# Patient Record
Sex: Male | Born: 1955 | Race: Black or African American | Hispanic: No | Marital: Married | Smoking: Former smoker
Health system: Southern US, Community
[De-identification: ages and names within clinical notes are randomized; demographics above are authoritative.]

## PROBLEM LIST (undated history)

## (undated) DIAGNOSIS — T7840XA Allergy, unspecified, initial encounter: Secondary | ICD-10-CM

## (undated) DIAGNOSIS — I10 Essential (primary) hypertension: Secondary | ICD-10-CM

## (undated) DIAGNOSIS — B1920 Unspecified viral hepatitis C without hepatic coma: Secondary | ICD-10-CM

## (undated) HISTORY — DX: Essential (primary) hypertension: I10

## (undated) HISTORY — DX: Allergy, unspecified, initial encounter: T78.40XA

## (undated) HISTORY — PX: OTHER SURGICAL HISTORY: SHX169

---

## 2002-12-17 ENCOUNTER — Encounter: Admission: RE | Admit: 2002-12-17 | Discharge: 2002-12-17 | Payer: Self-pay | Admitting: Internal Medicine

## 2004-03-17 ENCOUNTER — Emergency Department (HOSPITAL_COMMUNITY): Admission: EM | Admit: 2004-03-17 | Discharge: 2004-03-17 | Payer: Self-pay | Admitting: Emergency Medicine

## 2006-04-01 HISTORY — PX: COLONOSCOPY: SHX174

## 2006-07-11 ENCOUNTER — Ambulatory Visit: Payer: Self-pay | Admitting: Gastroenterology

## 2006-07-25 ENCOUNTER — Ambulatory Visit: Payer: Self-pay | Admitting: Gastroenterology

## 2011-04-16 ENCOUNTER — Ambulatory Visit (INDEPENDENT_AMBULATORY_CARE_PROVIDER_SITE_OTHER): Payer: BC Managed Care – PPO

## 2011-04-16 DIAGNOSIS — Z0000 Encounter for general adult medical examination without abnormal findings: Secondary | ICD-10-CM

## 2011-04-16 DIAGNOSIS — N529 Male erectile dysfunction, unspecified: Secondary | ICD-10-CM

## 2011-04-16 DIAGNOSIS — R079 Chest pain, unspecified: Secondary | ICD-10-CM

## 2011-05-20 ENCOUNTER — Encounter: Payer: Self-pay | Admitting: Gastroenterology

## 2011-05-22 ENCOUNTER — Ambulatory Visit (INDEPENDENT_AMBULATORY_CARE_PROVIDER_SITE_OTHER): Payer: BC Managed Care – PPO | Admitting: Family Medicine

## 2011-05-22 ENCOUNTER — Ambulatory Visit: Payer: BC Managed Care – PPO

## 2011-05-22 VITALS — BP 148/83 | HR 66 | Temp 98.0°F | Resp 18 | Ht 65.0 in | Wt 178.0 lb

## 2011-05-22 DIAGNOSIS — R911 Solitary pulmonary nodule: Secondary | ICD-10-CM

## 2011-05-22 DIAGNOSIS — R7401 Elevation of levels of liver transaminase levels: Secondary | ICD-10-CM

## 2011-05-22 DIAGNOSIS — B182 Chronic viral hepatitis C: Secondary | ICD-10-CM

## 2011-05-22 DIAGNOSIS — R748 Abnormal levels of other serum enzymes: Secondary | ICD-10-CM

## 2011-05-22 NOTE — Progress Notes (Signed)
  Subjective:    Patient ID: Gregory Holloway, male    DOB: Jan 24, 1956, 56 y.o.   MRN: [id number]  HPI    Review of Systems     Objective:   Physical Exam   [facility] reading (PRIMARY) by  Dr. Alwyn Ren Normal xray      Assessment & Plan:
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
## 2011-05-23 LAB — HEPATIC FUNCTION PANEL
Bilirubin, Direct: 0.1 mg/dL (ref 0.0–0.3)
Indirect Bilirubin: 0.6 mg/dL (ref 0.0–0.9)

## 2011-05-24 ENCOUNTER — Encounter: Payer: Self-pay | Admitting: Family Medicine

## 2013-11-27 ENCOUNTER — Ambulatory Visit (INDEPENDENT_AMBULATORY_CARE_PROVIDER_SITE_OTHER): Payer: BC Managed Care – PPO | Admitting: Family Medicine

## 2013-11-27 VITALS — BP 164/88 | HR 63 | Temp 97.8°F | Resp 15 | Ht 66.0 in | Wt 175.2 lb

## 2013-11-27 DIAGNOSIS — Z8719 Personal history of other diseases of the digestive system: Secondary | ICD-10-CM

## 2013-11-27 DIAGNOSIS — Z23 Encounter for immunization: Secondary | ICD-10-CM

## 2013-11-27 DIAGNOSIS — B182 Chronic viral hepatitis C: Secondary | ICD-10-CM

## 2013-11-27 DIAGNOSIS — Z0000 Encounter for general adult medical examination without abnormal findings: Secondary | ICD-10-CM

## 2013-11-27 LAB — POCT URINALYSIS DIPSTICK
Bilirubin, UA: NEGATIVE
Glucose, UA: NEGATIVE
Ketones, UA: NEGATIVE
Leukocytes, UA: NEGATIVE
Nitrite, UA: NEGATIVE
Protein, UA: NEGATIVE
Spec Grav, UA: 1.015
Urobilinogen, UA: 0.2
pH, UA: 5

## 2013-11-27 LAB — POCT CBC
Granulocyte percent: 45.7 %G (ref 37–80)
HCT, POC: 43 % — AB (ref 43.5–53.7)
Hemoglobin: 13.7 g/dL — AB (ref 14.1–18.1)
Lymph, poc: 3 (ref 0.6–3.4)
MCH, POC: 30.2 pg (ref 27–31.2)
MCHC: 31.7 g/dL — AB (ref 31.8–35.4)
MCV: 95.2 fL (ref 80–97)
MID (cbc): 0.4 (ref 0–0.9)
MPV: 8.6 fL (ref 0–99.8)
POC Granulocyte: 2.9 (ref 2–6.9)
POC LYMPH PERCENT: 47.5 %L (ref 10–50)
POC MID %: 6.8 %M (ref 0–12)
Platelet Count, POC: 192 10*3/uL (ref 142–424)
RBC: 4.52 M/uL — AB (ref 4.69–6.13)
RDW, POC: 12.1 %
WBC: 6.3 10*3/uL (ref 4.6–10.2)

## 2013-11-27 LAB — COMPREHENSIVE METABOLIC PANEL
ALT: 53 U/L (ref 0–53)
AST: 35 U/L (ref 0–37)
Albumin: 4.2 g/dL (ref 3.5–5.2)
Alkaline Phosphatase: 57 U/L (ref 39–117)
BUN: 12 mg/dL (ref 6–23)
CO2: 28 mEq/L (ref 19–32)
Calcium: 9.6 mg/dL (ref 8.4–10.5)
Chloride: 105 mEq/L (ref 96–112)
Creat: 1.27 mg/dL (ref 0.50–1.35)
Glucose, Bld: 93 mg/dL (ref 70–99)
Potassium: 4.3 mEq/L (ref 3.5–5.3)
Sodium: 140 mEq/L (ref 135–145)
Total Bilirubin: 0.7 mg/dL (ref 0.2–1.2)
Total Protein: 7.9 g/dL (ref 6.0–8.3)

## 2013-11-27 LAB — LIPID PANEL
Cholesterol: 161 mg/dL (ref 0–200)
HDL: 38 mg/dL — ABNORMAL LOW (ref >39)
LDL Cholesterol: 109 mg/dL — ABNORMAL HIGH (ref 0–99)
Total CHOL/HDL Ratio: 4.2 Ratio
Triglycerides: 68 mg/dL (ref <150)
VLDL: 14 mg/dL (ref 0–40)

## 2013-11-27 LAB — HEPATITIS C ANTIBODY: HCV Ab: REACTIVE — AB

## 2013-11-27 LAB — TSH: TSH: 1.073 u[IU]/mL (ref 0.350–4.500)

## 2013-11-27 MED ORDER — TETANUS-DIPHTH-ACELL PERTUSSIS 5-2.5-18.5 LF-MCG/0.5 IM SUSP: 0.5000 mL | Freq: Once | INTRAMUSCULAR | Status: DC

## 2013-11-27 NOTE — Progress Notes (Addendum)
° °  Subjective:    Patient ID: Gregory Holloway, male    DOB: Aug 14, 1955, 58 y.o.   MRN: [id number]  HPI Chief Complaint  Patient presents with   Annual Exam   This chart was scribed for Elvina Sidle , MD by Andrew Au, ED Scribe. This patient was seen in room 8 and the patient's care was started at 10:41 AM.  HPI Comments: Gregory Holloway is a 58 y.o. male who presents to the [facility] for a physical. Pt is married with 6 children.   Colonoscopy 2009. Pt was diagnosed with Hep C 15 years ago. Pt was a former drug abuser. Pt has not smoke or drank alcohol in 23 years.  Pt has multiple complaint.  Pt c/o muscle ache and fatigue.  He reports dizziness and light headedness after working in yard. Pt reports SOB with movement. He denies SOB waking him up at night.  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   History reviewed. No pertinent past medical history. History reviewed. No pertinent past surgical history. Prior to Admission medications   Not on File   Review of Systems  Constitutional: Positive for fatigue.  Respiratory: Positive for shortness of breath.   Musculoskeletal: Positive for arthralgias and myalgias.  Skin: Positive for rash.  Neurological: Positive for dizziness and light-headedness.   Objective:   Physical Exam  Nursing note and vitals reviewed. Constitutional: He is oriented to person, place, and time. He appears well-developed and well-nourished. No distress.  HENT:  Head: Normocephalic and atraumatic.  Eyes: Conjunctivae and EOM are normal.  Neck: Neck supple.  Cardiovascular: Normal rate, regular rhythm and normal heart sounds.  Exam reveals no gallop and no friction rub.   No murmur heard. Pulmonary/Chest: Effort normal and breath sounds normal. No respiratory distress. He has no wheezes. He has no rales. He exhibits no tenderness.  Abdominal: Soft. Bowel sounds are normal. He  exhibits no distension and no mass. There is no tenderness. There is no rebound and no guarding.  Genitourinary: Rectum normal.  Normal rectal exam  Musculoskeletal: Normal range of motion. He exhibits edema ( trace).  Neurological: He is alert and oriented to person, place, and time.  Skin: Skin is warm and dry.  Psychiatric: He has a normal mood and affect. His behavior is normal.   Assessment & Plan:    Routine general medical examination at a health care facility - Plan: EKG 12-Lead, POCT CBC, POCT urinalysis dipstick, TSH, PSA, Lipid panel, Hepatitis C antibody, Comprehensive metabolic panel  Hep C w/o coma, chronic - Plan: EKG 12-Lead, POCT CBC, POCT urinalysis dipstick, TSH, PSA, Lipid panel, Hepatitis C antibody, Comprehensive metabolic panel  H/O chronic hepatitis  Signed, Elvina Sidle, MD

## 2013-11-27 NOTE — Patient Instructions (Signed)
Physical exam is essentially normal. I don't detect any problems with the prostate, abdomen, chest or heart. EKG is normal as well. We're going to see if there is a thyroid problem, probably cholesterol, or anemia. These tests will take 3-4 days to run and he should get a call back Tuesday Wednesday next week.    Health Maintenance A healthy lifestyle and preventative care can promote health and wellness.  Maintain regular health, dental, and eye exams.  Eat a healthy diet. Foods like vegetables, fruits, whole grains, low-fat dairy products, and lean protein foods contain the nutrients you need and are low in calories. Decrease your intake of foods high in solid fats, added sugars, and salt. Get information about a proper diet from your health care provider, if necessary.  Regular physical exercise is one of the most important things you can do for your health. Most adults should get at least 150 minutes of moderate-intensity exercise (any activity that increases your heart rate and causes you to sweat) each week. In addition, most adults need muscle-strengthening exercises on 2 or more days a week.   Maintain a healthy weight. The body mass index (BMI) is a screening tool to identify possible weight problems. It provides an estimate of body fat based on height and weight. Your health care provider can find your BMI and can help you achieve or maintain a healthy weight. For males 20 years and older:  A BMI below 18.5 is considered underweight.  A BMI of 18.5 to 24.9 is normal.  A BMI of 25 to 29.9 is considered overweight.  A BMI of 30 and above is considered obese.  Maintain normal blood lipids and cholesterol by exercising and minimizing your intake of saturated fat. Eat a balanced diet with plenty of fruits and vegetables. Blood tests for lipids and cholesterol should begin at age 10 and be repeated every 5 years. If your lipid or cholesterol levels are high, you are over age 33, or you  are at high risk for heart disease, you may need your cholesterol levels checked more frequently.Ongoing high lipid and cholesterol levels should be treated with medicines if diet and exercise are not working.  If you smoke, find out from your health care provider how to quit. If you do not use tobacco, do not start.  Lung cancer screening is recommended for adults aged 55-80 years who are at high risk for developing lung cancer because of a history of smoking. A yearly low-dose CT scan of the lungs is recommended for people who have at least a 30-pack-year history of smoking and are current smokers or have quit within the past 15 years. A pack year of smoking is smoking an average of 1 pack of cigarettes a day for 1 year (for example, a 30-pack-year history of smoking could mean smoking 1 pack a day for 30 years or 2 packs a day for 15 years). Yearly screening should continue until the smoker has stopped smoking for at least 15 years. Yearly screening should be stopped for people who develop a health problem that would prevent them from having lung cancer treatment.  If you choose to drink alcohol, do not have more than 2 drinks per day. One drink is considered to be 12 oz (360 mL) of beer, 5 oz (150 mL) of wine, or 1.5 oz (45 mL) of liquor.  Avoid the use of street drugs. Do not share needles with anyone. Ask for help if you need support or instructions about  stopping the use of drugs.  High blood pressure causes heart disease and increases the risk of stroke. Blood pressure should be checked at least every 1-2 years. Ongoing high blood pressure should be treated with medicines if weight loss and exercise are not effective.  If you are 64-64 years old, ask your health care provider if you should take aspirin to prevent heart disease.  Diabetes screening involves taking a blood sample to check your fasting blood sugar level. This should be done once every 3 years after age 72 if you are at a normal  weight and without risk factors for diabetes. Testing should be considered at a younger age or be carried out more frequently if you are overweight and have at least 1 risk factor for diabetes.  Colorectal cancer can be detected and often prevented. Most routine colorectal cancer screening begins at the age of 3 and continues through age 61. However, your health care provider may recommend screening at an earlier age if you have risk factors for colon cancer. On a yearly basis, your health care provider may provide home test kits to check for hidden blood in the stool. A small camera at the end of a tube may be used to directly examine the colon (sigmoidoscopy or colonoscopy) to detect the earliest forms of colorectal cancer. Talk to your health care provider about this at age 24 when routine screening begins. A direct exam of the colon should be repeated every 5-10 years through age 74, unless early forms of precancerous polyps or small growths are found.  People who are at an increased risk for hepatitis B should be screened for this virus. You are considered at high risk for hepatitis B if:  You were born in a country where hepatitis B occurs often. Talk with your health care provider about which countries are considered high risk.  Your parents were born in a high-risk country and you have not received a shot to protect against hepatitis B (hepatitis B vaccine).  You have HIV or AIDS.  You use needles to inject street drugs.  You live with, or have sex with, someone who has hepatitis B.  You are a man who has sex with other men (MSM).  You get hemodialysis treatment.  You take certain medicines for conditions like cancer, organ transplantation, and autoimmune conditions.  Hepatitis C blood testing is recommended for all people born from [birth year] through 1966 and any individual with known risk factors for hepatitis C.  Healthy men should no longer receive prostate-specific antigen (PSA) blood  tests as part of routine cancer screening. Talk to your health care provider about prostate cancer screening.  Testicular cancer screening is not recommended for adolescents or adult males who have no symptoms. Screening includes self-exam, a health care provider exam, and other screening tests. Consult with your health care provider about any symptoms you have or any concerns you have about testicular cancer.  Practice safe sex. Use condoms and avoid high-risk sexual practices to reduce the spread of sexually transmitted infections (STIs).  You should be screened for STIs, including gonorrhea and chlamydia if:  You are sexually active and are younger than 24 years.  You are older than 24 years, and your health care provider tells you that you are at risk for this type of infection.  Your sexual activity has changed since you were last screened, and you are at an increased risk for chlamydia or gonorrhea. Ask your health care provider if you are  at risk.  If you are at risk of being infected with HIV, it is recommended that you take a prescription medicine daily to prevent HIV infection. This is called pre-exposure prophylaxis (PrEP). You are considered at risk if:  You are a man who has sex with other men (MSM).  You are a heterosexual man who is sexually active with multiple partners.  You take drugs by injection.  You are sexually active with a partner who has HIV.  Talk with your health care provider about whether you are at high risk of being infected with HIV. If you choose to begin PrEP, you should first be tested for HIV. You should then be tested every 3 months for as long as you are taking PrEP.  Use sunscreen. Apply sunscreen liberally and repeatedly throughout the day. You should seek shade when your shadow is shorter than you. Protect yourself by wearing long sleeves, pants, a wide-brimmed hat, and sunglasses year round whenever you are outdoors.  Tell your health care  provider of new moles or changes in moles, especially if there is a change in shape or color. Also, tell your health care provider if a mole is larger than the size of a pencil eraser.  A one-time screening for abdominal aortic aneurysm (AAA) and surgical repair of large AAAs by ultrasound is recommended for men aged 65-75 years who are current or former smokers.  Stay current with your vaccines (immunizations). Document Released: 09/14/2007 Document Revised: 03/23/2013 Document Reviewed: 08/13/2010 [facility] Patient Information 2015 [place], [place]. This information is not intended to replace advice given to you by your health care provider. Make sure you discuss any questions you have with your health care provider.

## 2013-11-29 LAB — PSA: PSA: 1.25 ng/mL (ref <=4.00)

## 2013-12-11 ENCOUNTER — Telehealth: Payer: Self-pay

## 2013-12-11 NOTE — Telephone Encounter (Signed)
Patient dropped off form to be completed by Dr. Elbert Ewings. He recently had a physical with Dr. Elbert Ewings. Please call when ready.   Best: [phone]

## 2015-06-15 ENCOUNTER — Emergency Department (HOSPITAL_COMMUNITY)
Admission: EM | Admit: 2015-06-15 | Discharge: 2015-06-15 | Disposition: A | Discharge status: Home / Self Care | Payer: BLUE CROSS/BLUE SHIELD | Source: Home / Self Care | Attending: Family Medicine | Admitting: Family Medicine

## 2015-06-15 ENCOUNTER — Encounter (HOSPITAL_COMMUNITY): Payer: Self-pay | Admitting: Emergency Medicine

## 2015-06-15 DIAGNOSIS — R6889 Other general symptoms and signs: Secondary | ICD-10-CM | POA: Diagnosis not present

## 2015-06-15 NOTE — Discharge Instructions (Signed)
Influenza, Adult Influenza (flu) is an infection in the mouth, nose, and throat (respiratory tract) caused by a virus. The flu can make you feel very ill. Influenza spreads easily from person to person (contagious).  HOME CARE   Only take medicines as told by your doctor.  Use a cool mist humidifier to make breathing easier.  Get plenty of rest until your fever goes away. This usually takes 3 to 4 days.  Drink enough fluids to keep your pee (urine) clear or pale yellow.  Cover your mouth and nose when you cough or sneeze.  Wash your hands well to avoid spreading the flu.  Stay home from work or school until your fever has been gone for at least 1 full day.  Get a flu shot every year. GET HELP RIGHT AWAY IF:   You have trouble breathing or feel short of breath.  Your skin or nails turn blue.  You have severe neck pain or stiffness.  You have a severe headache, facial pain, or earache.  Your fever gets worse or keeps coming back.  You feel sick to your stomach (nauseous), throw up (vomit), or have watery poop (diarrhea).  You have chest pain.  You have a deep cough that gets worse, or you cough up more thick spit (mucus). MAKE SURE YOU:   Understand these instructions.  Will watch your condition.  Will get help right away if you are not doing well or get worse.   This information is not intended to replace advice given to you by your health care provider. Make sure you discuss any questions you have with your health care provider.   Document Released: 12/26/2007 Document Revised: 04/08/2014 Document Reviewed: 06/17/2011 Elsevier Interactive Patient Education 2016 [employer]  

## 2015-06-15 NOTE — ED Provider Notes (Signed)
CSN: [id number]     Arrival date & time 06/15/15  1743 History   First MD Initiated Contact with Patient 06/15/15 1857     Chief Complaint  Patient presents with  . URI   (Consider location/radiation/quality/duration/timing/severity/associated sxs/prior Treatment) HPI Pt presents with sore throat, body aches, fever, chills for 1 days Home treatment has been OTC meds without much relief of symptoms Fever is improved for short periods of time with OTC antipyretics. Pain score is 4 mostly from coughing and body aches Taking fluids, no appetite No flu shot Has been exposed to others with similar symptoms.  Denies: CP, SOB, vomiting or diarrhea.  History reviewed. No pertinent past medical history. History reviewed. No pertinent past surgical history. Family History  Problem Relation Age of Onset  . Hypertension Sister    Social History  Substance Use Topics  . Smoking status: Former Games developermoker  . Smokeless tobacco: Never Used  . Alcohol Use: No    Review of Systems See HPI Allergies  Review of patient's allergies indicates no known allergies.  Home Medications   Prior to Admission medications   Medication Sig Start Date End Date Taking? Authorizing Provider  fexofenadine-pseudoephedrine (ALLEGRA-D 24) 180-240 MG 24 hr tablet Take 1 tablet by mouth daily.   Yes Historical Provider, MD  OVER THE COUNTER MEDICATION otc cold medicine   Yes Historical Provider, MD   Meds Ordered and Administered this Visit  Medications - No data to display  BP 155/84 mmHg  Pulse 96  Temp(Src) 100.9 F (38.3 C) (Oral)  Resp 16  SpO2 98% No data found.   Physical Exam NURSES NOTES AND VITAL SIGNS REVIEWED. CONSTITUTIONAL: Well developed, well nourished, no acute distress HEENT: normocephalic, atraumatic, right and left TM's are normal EYES: Conjunctiva normal NECK:normal ROM, supple, no adenopathy PULMONARY:No respiratory distress, normal effort, Lungs: CTAb/l, no wheezes, or increased  work of breathing CARDIOVASCULAR: RRR, no murmur ABDOMEN: soft, ND, NT, +'ve BS MUSCULOSKELETAL: Normal ROM of all extremities,  SKIN: warm and dry without rash PSYCHIATRIC: Mood and affect, behavior are normal  ED Course  Procedures (including critical care time)  Labs Review Labs Reviewed - No data to display  Imaging Review No results found.   Visual Acuity Review  Right Eye Distance:   Left Eye Distance:   Bilateral Distance:    Right Eye Near:   Left Eye Near:    Bilateral Near:         MDM   1. Flu-like symptoms     Patient is reassured that there are no issues that require transfer to higher level of care at this time or additional tests. Patient is advised to continue home symptomatic treatment. Patient is advised that if there are new or worsening symptoms to attend the emergency department, contact primary care provider, or return to UC. Instructions of care provided discharged home in stable condition. Return to work/school note provided.   THIS NOTE WAS GENERATED USING A VOICE RECOGNITION SOFTWARE PROGRAM. ALL REASONABLE EFFORTS  WERE MADE TO PROOFREAD THIS DOCUMENT FOR ACCURACY.  I have verbally reviewed the discharge instructions with the patient. A printed AVS was given to the patient.  All questions were answered prior to discharge.      Tharon AquasFrank C Dhairya Corales, PA 06/15/15 2119

## 2015-06-15 NOTE — ED Notes (Signed)
Patient reports symptoms started on Monday.  Reports aching, stuffy head, coughing, sneezing, non-productive cough.  Reports unknown if running a fever.  And has had chills

## 2015-09-16 ENCOUNTER — Ambulatory Visit (INDEPENDENT_AMBULATORY_CARE_PROVIDER_SITE_OTHER): Payer: BLUE CROSS/BLUE SHIELD | Admitting: Internal Medicine

## 2015-09-16 VITALS — BP 120/86 | HR 61 | Temp 98.1°F | Resp 16 | Ht 66.0 in | Wt 172.0 lb

## 2015-09-16 DIAGNOSIS — Z0000 Encounter for general adult medical examination without abnormal findings: Secondary | ICD-10-CM

## 2015-09-16 DIAGNOSIS — B182 Chronic viral hepatitis C: Secondary | ICD-10-CM

## 2015-09-16 DIAGNOSIS — Z23 Encounter for immunization: Secondary | ICD-10-CM | POA: Diagnosis not present

## 2015-09-16 LAB — CBC WITH DIFFERENTIAL/PLATELET
BASOS PCT: 0 %
Basophils Absolute: 0 cells/uL (ref 0–200)
EOS ABS: 49 {cells}/uL (ref 15–500)
Eosinophils Relative: 1 %
HCT: 44.9 % (ref 38.5–50.0)
Hemoglobin: 14.9 g/dL (ref 13.2–17.1)
LYMPHS PCT: 47 %
Lymphs Abs: 2303 cells/uL (ref 850–3900)
MCH: 31.1 pg (ref 27.0–33.0)
MCHC: 33.2 g/dL (ref 32.0–36.0)
MCV: 93.7 fL (ref 80.0–100.0)
MPV: 10.8 fL (ref 7.5–12.5)
Monocytes Absolute: 441 cells/uL (ref 200–950)
Monocytes Relative: 9 %
NEUTROS ABS: 2107 {cells}/uL (ref 1500–7800)
Neutrophils Relative %: 43 %
PLATELETS: 209 10*3/uL (ref 140–400)
RBC: 4.79 MIL/uL (ref 4.20–5.80)
RDW: 12.1 % (ref 11.0–15.0)
WBC: 4.9 10*3/uL (ref 3.8–10.8)

## 2015-09-16 LAB — HEPATITIS B SURFACE ANTIBODY, QUANTITATIVE: Hepatitis B-Post: 0 m[IU]/mL

## 2015-09-16 LAB — LIPID PANEL
CHOL/HDL RATIO: 3.7 ratio (ref <=5.0)
Cholesterol: 148 mg/dL (ref 125–200)
HDL: 40 mg/dL (ref >=40)
LDL Cholesterol: 98 mg/dL (ref <130)
Triglycerides: 52 mg/dL (ref <150)
VLDL: 10 mg/dL (ref <30)

## 2015-09-16 LAB — COMPREHENSIVE METABOLIC PANEL
ALK PHOS: 54 U/L (ref 40–115)
ALT: 56 U/L — AB (ref 9–46)
AST: 34 U/L (ref 10–35)
Albumin: 4.1 g/dL (ref 3.6–5.1)
BUN: 16 mg/dL (ref 7–25)
CO2: 26 mmol/L (ref 20–31)
Calcium: 9.3 mg/dL (ref 8.6–10.3)
Chloride: 104 mmol/L (ref 98–110)
Creat: 1.53 mg/dL — ABNORMAL HIGH (ref 0.70–1.25)
Glucose, Bld: 89 mg/dL (ref 65–99)
Potassium: 5 mmol/L (ref 3.5–5.3)
Sodium: 139 mmol/L (ref 135–146)
TOTAL PROTEIN: 7.6 g/dL (ref 6.1–8.1)
Total Bilirubin: 0.5 mg/dL (ref 0.2–1.2)

## 2015-09-16 LAB — HEPATITIS B SURFACE ANTIGEN: Hepatitis B Surface Ag: NEGATIVE

## 2015-09-16 LAB — HIV ANTIBODY (ROUTINE TESTING W REFLEX): HIV: NONREACTIVE

## 2015-09-16 MED ORDER — ZOSTER VACCINE LIVE 19400 UNT/0.65ML ~~LOC~~ SUSR: 0.6500 mL | Freq: Once | SUBCUTANEOUS | Status: DC

## 2015-09-16 NOTE — Patient Instructions (Signed)
     IF you received an x-ray today, you will receive an invoice from [facility]. Please contact [facility] at [phone] with questions or concerns regarding your invoice.   IF you received labwork today, you will receive an invoice from [facility]. Please contact [facility] at [phone] with questions or concerns regarding your invoice.   Our billing staff will not be able to assist you with questions regarding bills from these companies.  You will be contacted with the lab results as soon as they are available. The fastest way to get your results is to activate your My Chart account. Instructions are located on the last page of this paperwork. If you have not heard from us regarding the results in 2 weeks, please contact this office.      

## 2015-09-16 NOTE — Progress Notes (Addendum)
Subjective:  By signing my name below, I, [name], attest that this documentation has been prepared under the direction and in the presence of [name], MD.  Electronically Signed: [name], ED Scribe. 09/16/2015. 10:28 AM.    Patient ID: Gregory Holloway, male    DOB: 10-17-1955, 60 y.o.   MRN: [id number]  HPI Chief Complaint  Patient presents with  . Annual Exam    Complete Physical, Urine collected     HPI Comments: Gregory Holloway is a 60 y.o. male who presents to the [facility] for a annual exam.   Immunization Immunization History  Administered Date(s) Administered  . Tdap 11/27/2013   Last TDAP was 2 years ago.   Exercise  Pt does not exercise regularly. He tries to eat healthy.   Vision  Visual Acuity Screening   Right eye Left eye Both eyes  Without correction:     With correction: 20/25 20/25 20/20    Pt is seen by optho every 3 years. Pt wears glasses.   Hep C screening Pt agrees to Hep C testing.   Other complaints  Pt reports SOB 6 days ago. This resolved on it own.  He denies hx of asthma He reports HA 6 days ago. He believes HA was due to sinuses.   Patient Active Problem List   Diagnosis Date Noted  . H/O chronic hepatitis 11/27/2013   No past medical history on file. No past surgical history on file. No Known Allergies Prior to Admission medications   Medication Sig Start Date End Date Taking? Authorizing Provider  fexofenadine-pseudoephedrine (ALLEGRA-D 24) 180-240 MG 24 hr tablet Take 1 tablet by mouth daily.   Yes Historical Provider, MD  OVER THE COUNTER MEDICATION Reported on 09/16/2015    Historical Provider, MD   Social History   Social History  . Marital Status: Married    Spouse Name: N/A  . Number of Children: N/A  . Years of Education: N/A   Occupational History  . Not on file.   Social History Main Topics  . Smoking status: Former Research scientist (life sciences)  . Smokeless tobacco: Never Used  . Alcohol Use: No  .  Drug Use: No  . Sexual Activity: Not on file   Other Topics Concern  . Not on file   Social History Narrative   Review of Systems  Respiratory: Positive for shortness of breath.   Allergic/Immunologic: Positive for environmental allergies.  Neurological: Positive for headaches.  All other systems reviewed and are negative.      Objective:   Physical Exam  Constitutional: He is oriented to person, place, and time. He appears well-developed and well-nourished.  HENT:  Head: Normocephalic and atraumatic.  Right Ear: Hearing, tympanic membrane, external ear and ear canal normal.  Left Ear: Hearing, tympanic membrane, external ear and ear canal normal.  Nose: Nose normal.  Mouth/Throat: Uvula is midline, oropharynx is clear and moist and mucous membranes are normal.  Eyes: Conjunctivae, EOM and lids are normal. Pupils are equal, round, and reactive to light. Right eye exhibits no discharge. Left eye exhibits no discharge. No scleral icterus.  Neck: Trachea normal and normal range of motion. Neck supple. Carotid bruit is not present.  Cardiovascular: Normal rate, regular rhythm, normal heart sounds, intact distal pulses and normal pulses.   No murmur heard. Pulmonary/Chest: Effort normal and breath sounds normal. No respiratory distress. He has no wheezes. He has no rhonchi. He has no rales.  Abdominal: Soft. Normal appearance  and bowel sounds are normal. He exhibits no abdominal bruit. There is no tenderness.  Musculoskeletal: Normal range of motion. He exhibits no edema or tenderness.  Lymphadenopathy:       Head (right side): No submental, no submandibular, no tonsillar, no preauricular, no posterior auricular and no occipital adenopathy present.       Head (left side): No submental, no submandibular, no tonsillar, no preauricular, no posterior auricular and no occipital adenopathy present.    He has no cervical adenopathy.  Neurological: He is alert and oriented to person, place,  and time. He has normal strength and normal reflexes. No cranial nerve deficit or sensory deficit. Coordination and gait normal.  Skin: Skin is warm, dry and intact. No lesion and no rash noted.  Psychiatric: He has a normal mood and affect. His speech is normal and behavior is normal. Judgment and thought content normal.   Filed Vitals:   09/16/15 0936  BP: 120/86  Pulse: 61  Temp: 98.1 F (36.7 C)  TempSrc: Oral  Resp: 16  Height: 5' 6"  (1.676 m)  Weight: 172 lb (78.019 kg)  SpO2: 98%     Assessment & Plan:  Annual physical exam - Plan: CBC with Differential/Platelet, Comprehensive metabolic panel, Lipid panel, PSA  Hep C w/o coma, chronic (HCC) - Plan: HCV RNA quant rflx ultra or genotyp, Hepatitis B surface antibody, Hepatitis B surface antigen, HIV antibody  zostavax  I have completed the patient encounter in its entirety as documented by the scribe, with editing by me where necessary. Gregory Holloway P. Gregory Holloway, M.D.    [facility] Results for orders placed or performed in visit on 09/16/15  HCV RNA quant rflx ultra or genotyp  Result Value Ref Range   HCV Quantitative 122498 (H) <15 IU/mL   HCV Quantitative Log 5.09 (H) <1.18 log 10  CBC with Differential/Platelet  Result Value Ref Range   WBC 4.9 3.8 - 10.8 K/uL   RBC 4.79 4.20 - 5.80 MIL/uL   Hemoglobin 14.9 13.2 - 17.1 g/dL   HCT 44.9 38.5 - 50.0 %   MCV 93.7 80.0 - 100.0 fL   MCH 31.1 27.0 - 33.0 pg   MCHC 33.2 32.0 - 36.0 g/dL   RDW 12.1 11.0 - 15.0 %   Platelets 209 140 - 400 K/uL   MPV 10.8 7.5 - 12.5 fL   Neutro Abs 2107 1500 - 7800 cells/uL   Lymphs Abs 2303 850 - 3900 cells/uL   Monocytes Absolute 441 200 - 950 cells/uL   Eosinophils Absolute 49 15 - 500 cells/uL   Basophils Absolute 0 0 - 200 cells/uL   Neutrophils Relative % 43 %   Lymphocytes Relative 47 %   Monocytes Relative 9 %   Eosinophils Relative 1 %   Basophils Relative 0 %   Smear Review Criteria for review not met   Comprehensive  metabolic panel  Result Value Ref Range   Sodium 139 135 - 146 mmol/L   Potassium 5.0 3.5 - 5.3 mmol/L   Chloride 104 98 - 110 mmol/L   CO2 26 20 - 31 mmol/L   Glucose, Bld 89 65 - 99 mg/dL   BUN 16 7 - 25 mg/dL   Creat 1.53 (H) 0.70 - 1.25 mg/dL   Total Bilirubin 0.5 0.2 - 1.2 mg/dL   Alkaline Phosphatase 54 40 - 115 U/L   AST 34 10 - 35 U/L   ALT 56 (H) 9 - 46 U/L   Total Protein 7.6 6.1 - 8.1  g/dL   Albumin 4.1 3.6 - 5.1 g/dL   Calcium 9.3 8.6 - 10.3 mg/dL  Lipid panel  Result Value Ref Range   Cholesterol 148 125 - 200 mg/dL   Triglycerides 52 <150 mg/dL   HDL 40 >=40 mg/dL   Total CHOL/HDL Ratio 3.7 <=5.0 Ratio   VLDL 10 <30 mg/dL   LDL Cholesterol 98 <130 mg/dL  PSA  Result Value Ref Range   PSA 1.65 <=4.00 ng/mL  Hepatitis B surface antibody  Result Value Ref Range   Hepatitis B-Post 0.0 mIU/mL  Hepatitis B surface antigen  Result Value Ref Range   Hepatitis B Surface Ag NEGATIVE NEGATIVE  HIV antibody  Result Value Ref Range   HIV 1&2 Ab, 4th Generation NONREACTIVE NONREACTIVE  Hepatitis C genotype  Result Value Ref Range   HCV Genotype     Ref to ID

## 2015-09-18 LAB — PSA: PSA: 1.65 ng/mL (ref <=4.00)

## 2015-09-20 LAB — HCV RNA QUANT RFLX ULTRA OR GENOTYP
HCV Quantitative Log: 5.09 {Log} — ABNORMAL HIGH (ref <1.18)
HCV Quantitative: 122498 IU/mL — ABNORMAL HIGH (ref <15)

## 2015-09-20 NOTE — Addendum Note (Signed)
Addended by: Tonye PearsonOLITTLE, Smith Potenza P on: 09/20/2015 11:42 PM   Modules accepted: Orders

## 2015-09-25 LAB — HEPATITIS C GENOTYPE

## 2015-10-26 ENCOUNTER — Other Ambulatory Visit: Payer: BLUE CROSS/BLUE SHIELD

## 2015-10-26 DIAGNOSIS — B182 Chronic viral hepatitis C: Secondary | ICD-10-CM | POA: Diagnosis not present

## 2015-10-27 LAB — HEPATITIS A ANTIBODY, TOTAL: Hep A Total Ab: NONREACTIVE

## 2015-10-27 LAB — AFP TUMOR MARKER: AFP-Tumor Marker: 2.9 ng/mL (ref <6.1)

## 2015-10-27 LAB — PROTIME-INR
INR: 1
PROTHROMBIN TIME: 10.3 s (ref 9.0–11.5)

## 2015-10-27 LAB — HEPATITIS B CORE ANTIBODY, TOTAL: HEP B C TOTAL AB: REACTIVE — AB

## 2015-10-28 LAB — LIVER FIBROSIS, FIBROTEST-ACTITEST
ALPHA-2-MACROGLOBULIN: 312 mg/dL — AB (ref 106–279)
ALT: 42 U/L (ref 9–46)
Apolipoprotein A1: 121 mg/dL (ref 94–176)
Bilirubin: 0.3 mg/dL (ref 0.2–1.2)
FIBROSIS SCORE: 0.41
GGT: 24 U/L (ref 3–70)
Haptoglobin: 149 mg/dL (ref 43–212)
Necroinflammat ACT Score: 0.26
REFERENCE ID: 1589921

## 2015-11-01 ENCOUNTER — Encounter: Payer: Self-pay | Admitting: Internal Medicine

## 2015-11-01 ENCOUNTER — Ambulatory Visit (INDEPENDENT_AMBULATORY_CARE_PROVIDER_SITE_OTHER): Payer: BLUE CROSS/BLUE SHIELD | Admitting: Internal Medicine

## 2015-11-01 ENCOUNTER — Telehealth: Payer: Self-pay | Admitting: *Deleted

## 2015-11-01 VITALS — BP 141/84 | HR 93 | Temp 98.3°F | Ht 67.0 in | Wt 175.0 lb

## 2015-11-01 DIAGNOSIS — B182 Chronic viral hepatitis C: Secondary | ICD-10-CM

## 2015-11-01 DIAGNOSIS — Z23 Encounter for immunization: Secondary | ICD-10-CM | POA: Diagnosis not present

## 2015-11-01 LAB — HCV VIRAL RNA GEN3 NS5A DRUG RESIST: HCV NS5A SUBTYPE: NOT DETECTED

## 2015-11-01 MED ORDER — LEDIPASVIR-SOFOSBUVIR 90-400 MG PO TABS: 1.0000 | ORAL_TABLET | Freq: Every day | ORAL | 2 refills | Status: DC

## 2015-11-01 NOTE — Addendum Note (Signed)
Addended by: Andree Coss on: 11/01/2015 03:03 PM   Modules accepted: Orders

## 2015-11-01 NOTE — Progress Notes (Signed)
[facility]   CC: consideration for treatment for chronic hepatitis C  HPI:  +Gregory Holloway is a 60 y.o. male who presents for initial evaluation and management of chronic hepatitis C.  Patient tested positive several years ago. Hepatitis C-associated risk factors present are: IV drug abuse (details: remotely). Patient denies sexual contact with person with liver disease. Patient has had other studies performed. Results: hepatitis C RNA by PCR, result: positive. Patient has not had prior treatment for Hepatitis C. Patient does not have a past history of liver disease. Patient does not have a family history of liver disease. Patient does not  have associated signs or symptoms related to liver disease.  Labs reviewed and confirm chronic hepatitis C with a positive viral load.   Records reviewed from PCP.  Recently tested but had known already.    Takes occasional tums but ok to not take it during treatment. Is going on a cruise for 5 days later this month.     Patient does not have documented immunity to Hepatitis A. Patient does have documented immunity to Hepatitis B.    Review of Systems:   Constitutional: negative for malaise Gastrointestinal: negative for diarrhea All other systems reviewed and are negative      No past medical history on file.  Prior to Admission medications   Medication Sig Start Date End Date Taking? Authorizing Provider  fexofenadine-pseudoephedrine (ALLEGRA-D 24) 180-240 MG 24 hr tablet Take 1 tablet by mouth daily.   Yes Historical Provider, MD  Ledipasvir-Sofosbuvir (HARVONI) 90-400 MG TABS Take 1 tablet by mouth daily. 11/01/15   Gardiner Barefoot, MD    No Known Allergies  Social History  Substance Use Topics  . Smoking status: Former Games developer  . Smokeless tobacco: Never Used  . Alcohol use No    Family History  Problem Relation Age of Onset  . Hypertension Sister      Objective:  Constitutional: in no apparent distress  and alert,  Vitals:   11/01/15 1042  BP: (!) 141/84  Pulse: 93  Temp: 98.3 F (36.8 C)   Eyes: anicteric Cardiovascular: Cor RRR and No murmurs Respiratory: CTA B; normal respiratory effort Gastrointestinal: Bowel sounds are normal, liver is not enlarged, spleen is not enlarged Musculoskeletal: no pedal edema noted Skin: negatives: no rash; no porphyria cutanea tarda Lymphatic: no cervical lymphadenopathy   Laboratory Genotype:  Lab Results  Component Value Date   HCVGENOTYPE 1a 09/16/2015   HCV viral load:  Lab Results  Component Value Date   HCVQUANT 122,498 (H) 09/16/2015   Lab Results  Component Value Date   WBC 4.9 09/16/2015   HGB 14.9 09/16/2015   HCT 44.9 09/16/2015   MCV 93.7 09/16/2015   PLT 209 09/16/2015    Lab Results  Component Value Date   CREATININE 1.53 (H) 09/16/2015   BUN 16 09/16/2015   NA 139 09/16/2015   K 5.0 09/16/2015   CL 104 09/16/2015   CO2 26 09/16/2015    Lab Results  Component Value Date   ALT 42 10/26/2015   AST 34 09/16/2015   ALKPHOS 54 09/16/2015     Labs and history reviewed and show CHILD-PUGH A  5-6 points: Child class A 7-9 points: Child class B 10-15 points: Child class C  Lab Results  Component Value Date   INR 1.0 10/26/2015   BILITOT 0.5 09/16/2015   ALBUMIN 4.1 09/16/2015     Assessment: New Patient with Chronic Hepatitis  C genotype 1a, untreated.  I discussed with the patient the lab findings that confirm chronic hepatitis C as well as the natural history and progression of disease including about 30% of people who develop cirrhosis of the liver if left untreated and once cirrhosis is established there is a 2-7% risk per year of liver cancer and liver failure.  I discussed the importance of treatment and benefits in reducing the risk, even if significant liver fibrosis exists.   Plan: 1) Patient counseled extensively on limiting acetaminophen to no more than 2 grams daily, avoidance of alcohol. 2)  Transmission discussed with patient including sexual transmission, sharing razors and toothbrush.   3) Will need referral to gastroenterology if concern for cirrhosis 4) Will need referral for substance abuse counseling: No.; Further work up to include urine drug screen  No. 5) Will prescribe Harvoni for 12 weeks 6) Hepatitis A vaccine Yes.   7) Hepatitis B vaccine No. 8) Pneumovax vaccine if concern for cirrhosis 9) Further work up to include liver staging with elastography 10) will follow up after starting medication

## 2015-11-01 NOTE — Patient Instructions (Signed)
Date 11/01/15  Dear Mr. Hensarling, As discussed in the ID Clinic, your hepatitis C therapy will include the following medications:          Harvoni 90mg /400mg  tablet:           Take 1 tablet by mouth once daily   Please note that ALL MEDICATIONS WILL START ON THE SAME DATE for a total of 12 weeks. ---------------------------------------------------------------- Your HCV Treatment Start Date: TBA   Your HCV genotype:  1a    Liver Fibrosis: F3    ---------------------------------------------------------------- YOUR PHARMACY CONTACT:   [facility] [address] Phone: [phone] Hours: Monday to Friday 7:30 am to 6:00 pm   Please always contact your pharmacy at least 3-4 business days before you run out of medications to ensure your next month's medication is ready or 1 week prior to running out if you receive it by mail.  Remember, each prescription is for 28 days. ---------------------------------------------------------------- GENERAL NOTES REGARDING YOUR HEPATITIS C MEDICATION:  SOFOSBUVIR/LEDIPASVIR (HARVONI): - Harvoni tablet is taken daily with OR without food. - The tablets are orange. - The tablets should be stored at room temperature.  - Acid reducing agents such as H2 blockers (ie. Pepcid (famotidine), Zantac (ranitidine), Tagamet (cimetidine), Axid (nizatidine) and proton pump inhibitors (ie. Prilosec (omeprazole), Protonix (pantoprazole), Nexium (esomeprazole), or Aciphex (rabeprazole)) can decrease effectiveness of Harvoni. Do not take until you have discussed with a health care provider.    -Antacids that contain magnesium and/or aluminum hydroxide (ie. Milk of Magensia, Rolaids, Gaviscon, Maalox, Mylanta, an dArthritis Pain Formula)can reduce absorption of Harvoni, so take them at least 4 hours before or after Harvoni.  -Calcium carbonate (calcium supplements or antacids such as Tums, Caltrate,  Os-Cal)needs to be taken at least 4 hours hours before or after Harvoni.  -St. John's wort or any products that contain St. John's wort like some herbal supplements  Please inform the office prior to starting any of these medications.  - The common side effects associated with Harvoni include:      1. Fatigue      2. Headache      3. Nausea      4. Diarrhea      5. Insomnia  Please note that this only lists the most common side effects and is NOT a comprehensive list of the potential side effects of these medications. For more information, please review the drug information sheets that come with your medication package from the pharmacy.  ---------------------------------------------------------------- GENERAL HELPFUL HINTS ON HCV THERAPY: 1. Stay well-hydrated. 2. Notify the ID Clinic of any changes in your other over-the-counter/herbal or prescription medications. 3. If you miss a dose of your medication, take the missed dose as soon as you remember. Return to your regular time/dose schedule the next day.  4.  Do not stop taking your medications without first talking with your healthcare provider. 5.  You may take Tylenol (acetaminophen), as long as the dose is less than 2000 mg (OR no more than 4 tablets of the Tylenol Extra Strengths 500mg  tablet) in 24 hours. 6.  You will see our pharmacist-specialist within the first 2 weeks of starting your medication. 7.  You will need to obtain routine labs around week 4 and12 weeks after starting and then 3 to 6 months after finishing Harvoni.    Staci Righter, MD  [facility] [address] [place],  [place]  [address] [phone]

## 2015-11-01 NOTE — Telephone Encounter (Signed)
Patient scheduled for Elastography while in office. Appointment scheduled for 9/20 at 9:00, 8:45 arrival at [facility]. Patient to be NPO for 6-8 hours prior to procedure. Patient verbalized understanding and agreement.  Patient with [employer], no PA required. Andree Coss, RN

## 2015-11-06 ENCOUNTER — Other Ambulatory Visit: Payer: Self-pay | Admitting: Pharmacist

## 2015-11-06 MED ORDER — LEDIPASVIR-SOFOSBUVIR 90-400 MG PO TABS: 1.0000 | ORAL_TABLET | Freq: Every day | ORAL | 2 refills | Status: DC

## 2015-11-13 ENCOUNTER — Encounter: Payer: Self-pay | Admitting: Pharmacy Technician

## 2015-11-22 ENCOUNTER — Encounter: Payer: BLUE CROSS/BLUE SHIELD | Admitting: Internal Medicine

## 2015-12-07 ENCOUNTER — Emergency Department (HOSPITAL_COMMUNITY)
Admission: EM | Admit: 2015-12-07 | Discharge: 2015-12-07 | Disposition: A | Discharge status: Home / Self Care | Payer: BLUE CROSS/BLUE SHIELD | Attending: Emergency Medicine | Admitting: Emergency Medicine

## 2015-12-07 ENCOUNTER — Encounter (HOSPITAL_COMMUNITY): Payer: Self-pay

## 2015-12-07 DIAGNOSIS — IMO0001 Reserved for inherently not codable concepts without codable children: Secondary | ICD-10-CM

## 2015-12-07 DIAGNOSIS — Z87891 Personal history of nicotine dependence: Secondary | ICD-10-CM | POA: Diagnosis not present

## 2015-12-07 DIAGNOSIS — M79672 Pain in left foot: Secondary | ICD-10-CM | POA: Diagnosis present

## 2015-12-07 DIAGNOSIS — R030 Elevated blood-pressure reading, without diagnosis of hypertension: Secondary | ICD-10-CM | POA: Insufficient documentation

## 2015-12-07 DIAGNOSIS — M109 Gout, unspecified: Secondary | ICD-10-CM

## 2015-12-07 DIAGNOSIS — M10071 Idiopathic gout, right ankle and foot: Secondary | ICD-10-CM | POA: Diagnosis not present

## 2015-12-07 HISTORY — DX: Unspecified viral hepatitis C without hepatic coma: B19.20

## 2015-12-07 MED ORDER — TRAMADOL HCL 50 MG PO TABS: 50.0000 mg | ORAL_TABLET | Freq: Four times a day (QID) | ORAL | 0 refills | Status: DC | PRN

## 2015-12-07 MED ORDER — PREDNISONE 20 MG PO TABS: ORAL_TABLET | ORAL | 0 refills | Status: DC

## 2015-12-07 NOTE — ED Triage Notes (Signed)
Pt. Presents with complaint of L foot pain starting yesterday. Pt. Has swollen, red joint to L great toe. Pt. States first noticed yesterday, pt. Is able to bear weight but states it is painful. States he "guesses he hit it on something" but does not specifically remember any injury/trauma occurring.

## 2015-12-07 NOTE — ED Notes (Signed)
Patient comes in with c/o left foot pain around the big toe started yesterday. His foot is swollen, warm to touch, and reddened. Denies hx of gout or arthritis. Patient does not remember any injury. No fevers.

## 2015-12-07 NOTE — ED Notes (Signed)
Josh PA aware of pts blood pressures.

## 2015-12-07 NOTE — Discharge Instructions (Signed)
Please read and follow all provided instructions.  Your diagnoses today include:  1. Acute gout of right foot, unspecified cause    Tests performed today include:  Vital signs. See below for your results today.   Medications prescribed:   Prednisone - steroid medicine   It is best to take this medication in the morning to prevent sleeping problems. If you are diabetic, monitor your blood sugar closely and stop taking Prednisone if blood sugar is over 300. Take with food to prevent stomach upset.    Tramadol - narcotic-like pain medication  DO NOT drive or perform any activities that require you to be awake and alert because this medicine can make you drowsy.   Take any prescribed medications only as directed.  Home care instructions:   Follow any educational materials contained in this packet  Follow R.I.C.E. Protocol:  R - rest your injury   I  - use ice on injury without applying directly to skin  C - compress injury with bandage or splint  E - elevate the injury as much as possible  Follow-up instructions: Please follow-up with your primary care provider if you continue to have significant pain in 1 week.   Return instructions:   Please return if your toes or feet are numb or tingling, appear gray or blue, or you have severe pain (also elevate the leg and loosen splint or wrap if you were given one)  Return if you have redness, swelling, or warmth streaking up your leg or develop a fever.  Please return to the Emergency Department if you experience worsening symptoms.   Please return if you have any other emergent concerns.  Additional Information:  Your vital signs today were: Ht 5\' 7"  (1.702 m)    Wt 77.1 kg    BMI 26.63 kg/m  If your blood pressure (BP) was elevated above 135/85 this visit, please have this repeated by your doctor within one month. --------------

## 2015-12-07 NOTE — ED Provider Notes (Signed)
[facility]-EMERGENCY DEPT Provider Note   CSN: [id number] Arrival date & time: 12/07/15  0801     History   Chief Complaint Chief Complaint  Patient presents with  . Foot Pain    HPI Gregory Holloway is a 60 y.o. male.  Patient with history of hepatitis C who is on Harvoni -- presents with complaint of acute onset of left foot pain at the base of the great toe starting 2 days ago. No injuries reported. Patient reports recently being on a cruise and admits to eating red meat and seafood. No history of inflammatory arthritis or gout. He has had some in the past but nothing to this extent. No fevers, nausea or vomiting. The onset of this condition was acute. The course is constant. Aggravating factors: movement, ambulation. Alleviating factors: none.        Past Medical History:  Diagnosis Date  . Hepatitis C     Patient Active Problem List   Diagnosis Date Noted  . Chronic hepatitis C without hepatic coma (HCC) 11/27/2013    History reviewed. No pertinent surgical history.     Home Medications    Prior to Admission medications   Medication Sig Start Date End Date Taking? Authorizing Provider  fexofenadine-pseudoephedrine (ALLEGRA-D 24) 180-240 MG 24 hr tablet Take 1 tablet by mouth daily.    Historical Provider, MD  Ledipasvir-Sofosbuvir (HARVONI) 90-400 MG TABS Take 1 tablet by mouth daily. 11/06/15   Gardiner Barefootobert W Comer, MD  predniSONE (DELTASONE) 20 MG tablet 3 Tabs PO Days 1-3, then 2 tabs PO Days 4-6, then 1 tab PO Day 7-9, then Half Tab PO Day 10-12 12/07/15   Renne CriglerJoshua Tanea Moga, PA-C  traMADol (ULTRAM) 50 MG tablet Take 1 tablet (50 mg total) by mouth every 6 (six) hours as needed. 12/07/15   Renne CriglerJoshua Eragon Hammond, PA-C    Family History Family History  Problem Relation Age of Onset  . Hypertension Sister     Social History Social History  Substance Use Topics  . Smoking status: Former Games developermoker  . Smokeless tobacco: Never Used  . Alcohol use No     Allergies   Review of patient's  allergies indicates no known allergies.   Review of Systems Review of Systems  Constitutional: Negative for activity change.  Musculoskeletal: Positive for arthralgias and joint swelling. Negative for back pain, gait problem and neck pain.  Skin: Positive for color change. Negative for wound.  Neurological: Negative for weakness and numbness.     Physical Exam Updated Vital Signs BP (!) 168/116 (BP Location: Right Arm)   Pulse 86   Temp 98.8 F (37.1 C) (Oral)   Resp 21   Ht 5\' 7"  (1.702 m)   Wt 77.1 kg   SpO2 96%   BMI 26.63 kg/m   Physical Exam  Constitutional: He appears well-developed and well-nourished.  HENT:  Head: Normocephalic and atraumatic.  Eyes: Conjunctivae are normal.  Neck: Normal range of motion. Neck supple.  Cardiovascular: Normal pulses.  Exam reveals no decreased pulses.   Musculoskeletal: He exhibits tenderness. He exhibits no edema.  Mild swelling and erythema of the right MTP with pain with movement. No streaking or lymphangitis. No signs of cellulitis.  Neurological: He is alert. No sensory deficit.  Motor, sensation, and vascular distal to the injury is fully intact.   Skin: Skin is warm and dry.  Psychiatric: He has a normal mood and affect.  Nursing note and vitals reviewed.    ED Treatments / Results   Procedures  Procedures (including critical care time)   Initial Impression / Assessment and Plan / ED Course  I have reviewed the triage vital signs and the nursing notes.  Pertinent labs & imaging results that were available during my care of the patient were reviewed by me and considered in my medical decision making (see chart for details).  Clinical Course   Patient seen and examined.   Vital signs reviewed and are as follows: BP (!) 168/116 (BP Location: Right Arm)   Pulse 86   Temp 98.8 F (37.1 C) (Oral)   Resp 21   Ht 5\' 7"  (1.702 m)   Wt 77.1 kg   SpO2 96%   BMI 26.63 kg/m   Will treat for acute gout. Encouraged  PCP follow-up if not improved in one week. Patient also informed of high blood pressure. He states that his blood pressure does not normally run high. Encouraged to check blood pressure a couple of times in the next week prior to his next hepatitis appointment.  No history of GI bleeding, DM, kidney problems to preclude prednisone.   Final Clinical Impressions(s) / ED Diagnoses   Final diagnoses:  Acute gout of right foot, unspecified cause  Elevated blood pressure   Pain: Classic for acute gouty arthritis. Patient was recently on a cruise ship and this likely exacerbated his symptoms.  Elevated blood pressure: No history of hypertension. Patient to follow-up and have a recheck.  New Prescriptions New Prescriptions   PREDNISONE (DELTASONE) 20 MG TABLET    3 Tabs PO Days 1-3, then 2 tabs PO Days 4-6, then 1 tab PO Day 7-9, then Half Tab PO Day 10-12   TRAMADOL (ULTRAM) 50 MG TABLET    Take 1 tablet (50 mg total) by mouth every 6 (six) hours as needed.     Renne Crigler, PA-C 12/07/15 1610    Vanetta Mulders, MD 12/13/15 [phone]

## 2015-12-09 DIAGNOSIS — H5213 Myopia, bilateral: Secondary | ICD-10-CM | POA: Diagnosis not present

## 2015-12-09 DIAGNOSIS — H52223 Regular astigmatism, bilateral: Secondary | ICD-10-CM | POA: Diagnosis not present

## 2015-12-20 ENCOUNTER — Ambulatory Visit (HOSPITAL_COMMUNITY): Payer: BLUE CROSS/BLUE SHIELD

## 2015-12-20 ENCOUNTER — Telehealth: Payer: Self-pay

## 2015-12-20 ENCOUNTER — Ambulatory Visit: Payer: BLUE CROSS/BLUE SHIELD | Admitting: Pharmacist

## 2015-12-20 DIAGNOSIS — B182 Chronic viral hepatitis C: Secondary | ICD-10-CM

## 2015-12-20 NOTE — Progress Notes (Signed)
HPI: Gregory Holloway is a 60 y.o. male who presents to the [facility] pharmacy clinic today for follow-up of his Hep C. He has genotype 1a and started his Harvoni around 8/31. He is about 3 weeks into therapy.   Lab Results  Component Value Date   HCVGENOTYPE 1a 09/16/2015    Allergies: No Known Allergies  Past Medical History: Past Medical History:  Diagnosis Date  . Hepatitis C     Social History: Social History   Social History  . Marital status: Married    Spouse name: N/A  . Number of children: N/A  . Years of education: N/A   Social History Main Topics  . Smoking status: Former Games developermoker  . Smokeless tobacco: Never Used  . Alcohol use No  . Drug use: No  . Sexual activity: Not on file   Other Topics Concern  . Not on file   Social History Narrative  . No narrative on file    Labs: Hepatitis B Surface Ag (no units)  Date Value  09/16/2015 NEGATIVE   HCV Ab (no units)  Date Value  11/27/2013 REACTIVE (A)    Lab Results  Component Value Date   HCVGENOTYPE 1a 09/16/2015    Hepatitis C RNA quantitative Latest Ref Rng & Units 09/16/2015  HCV Quantitative <15 IU/mL 122,498(H)  HCV Quantitative Log <1.18 log 10 5.09(H)    AST (U/L)  Date Value  09/16/2015 34  11/27/2013 35  05/22/2011 73 (H)   ALT (U/L)  Date Value  10/26/2015 42  09/16/2015 56 (H)  11/27/2013 53  05/22/2011 126 (H)   INR (no units)  Date Value  10/26/2015 1.0    CrCl: CrCl cannot be calculated (Patient's most recent lab result is older than the maximum 21 days allowed.).  Fibrosis Score: F1/F2 as assessed by fibrosure   Child-Pugh Score: A  Previous Treatment Regimen: None  Assessment: Gregory Holloway is doing well on his Harvoni.  He tells me he was very tired when he first started therapy but it has since gotten better.  He is not on any other medications besides his Harvoni.  He does not take anything OTC or Rx for acid reflux or heartburn.  He tells me he takes his Harvoni  around noon every day and has not missed any doses.  I will make a follow-up appointment with Dr. Luciana Axeomer for end of treatment and get labs on him today.  He had a radiology appointment today for an US but he ate this morning so they had to reschedule.   Plans: - Continue Harvoni x 12 weeks - Hep C VL today - Radiology appointment 10/11 at 9am - F/u with Dr. Luciana Axeomer 12/12 at 9:30am  Marin Robertsassie L Stewart, BS, PharmD Infectious Diseases Clinical Pharmacist [facility] 12/20/2015, 12:24 PM

## 2015-12-20 NOTE — Telephone Encounter (Signed)
Pt is a  Previous doolittle pt and has left a form of a wellness visit form to be completed   Best number when finished [phone]

## 2015-12-20 NOTE — Telephone Encounter (Signed)
Form is not in my box. Happy to complete it, but don't know where it is.

## 2015-12-21 LAB — HEPATITIS C RNA QUANTITATIVE: HCV QUANT: NOT DETECTED [IU]/mL (ref <15)

## 2015-12-21 NOTE — Telephone Encounter (Signed)
Spoke with patient. Stated that he dropped the form off 12/20/2015.

## 2015-12-21 NOTE — Telephone Encounter (Signed)
Form found in Nurses' box at the desk, not delivered to provider box.  Form completed with info from 09/16/2015 visit with Dr. Merla Richesoolittle. Signed. Returned to [employer] box.

## 2015-12-21 NOTE — Telephone Encounter (Signed)
Spoke with pt to tell him that the form was ready to be picked up.

## 2015-12-25 DIAGNOSIS — Z23 Encounter for immunization: Secondary | ICD-10-CM | POA: Diagnosis not present

## 2016-01-10 ENCOUNTER — Ambulatory Visit (HOSPITAL_COMMUNITY)
Admission: RE | Admit: 2016-01-10 | Discharge: 2016-01-10 | Disposition: A | Discharge status: Home / Self Care | Payer: BLUE CROSS/BLUE SHIELD | Source: Ambulatory Visit | Attending: Internal Medicine | Admitting: Internal Medicine

## 2016-01-10 DIAGNOSIS — B182 Chronic viral hepatitis C: Secondary | ICD-10-CM | POA: Insufficient documentation

## 2016-01-10 DIAGNOSIS — N281 Cyst of kidney, acquired: Secondary | ICD-10-CM | POA: Diagnosis not present

## 2016-01-22 ENCOUNTER — Telehealth: Payer: Self-pay | Admitting: Pharmacy Technician

## 2016-01-22 NOTE — Telephone Encounter (Signed)
[employer] approved Harovni for 8 wks only.  He understands and will follow up with Dr. Luciana Axeomer at December 2017 appt.

## 2016-03-12 ENCOUNTER — Ambulatory Visit (INDEPENDENT_AMBULATORY_CARE_PROVIDER_SITE_OTHER): Payer: BLUE CROSS/BLUE SHIELD | Admitting: Internal Medicine

## 2016-03-12 ENCOUNTER — Encounter: Payer: Self-pay | Admitting: Internal Medicine

## 2016-03-12 VITALS — BP 152/96 | HR 60 | Temp 97.6°F | Wt 176.8 lb

## 2016-03-12 DIAGNOSIS — K7460 Unspecified cirrhosis of liver: Secondary | ICD-10-CM | POA: Insufficient documentation

## 2016-03-12 DIAGNOSIS — B182 Chronic viral hepatitis C: Secondary | ICD-10-CM | POA: Diagnosis not present

## 2016-03-12 DIAGNOSIS — Z23 Encounter for immunization: Secondary | ICD-10-CM | POA: Diagnosis not present

## 2016-03-12 DIAGNOSIS — J301 Allergic rhinitis due to pollen: Secondary | ICD-10-CM

## 2016-03-12 DIAGNOSIS — J302 Other seasonal allergic rhinitis: Secondary | ICD-10-CM | POA: Insufficient documentation

## 2016-03-12 NOTE — Assessment & Plan Note (Signed)
On allegra

## 2016-03-12 NOTE — Assessment & Plan Note (Signed)
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  ?EGD.  Also seems due for 10 year screening colonoscopy.  Will refer back to GI.  HCC screening every 6 months.  Will defer to PCP once he gets one.   Pneumovax given

## 2016-03-12 NOTE — Assessment & Plan Note (Signed)
EOt lab today and rtc 4-5 months for SVR 24, hepatitis A #2.

## 2016-03-12 NOTE — Addendum Note (Signed)
Addended by: Gardiner BarefootOMER, Janus Vlcek W on: 03/12/2016 09:33 AM   Modules accepted: Orders

## 2016-03-12 NOTE — Addendum Note (Signed)
Addended by: Rejeana BrockMURRAY, CANDACE A on: 03/12/2016 12:39 PM   Modules accepted: Orders

## 2016-03-12 NOTE — Progress Notes (Signed)
   Subjective:    Patient ID: Pricilla RiffleHarold J Rochin, male    DOB: 03-13-1956, 60 y.o.   MRN: [id number]  HPI Here for hep C follow up.   Has gentoype 1a, low viral load of 122,498 and completed 8 weeks of Harvoni.  No issues during treatment.  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  No alcohol.  Does not have a PCP at this time.  Had screening colonoscopy he thinks 10 years ago and Dr. Arlyce DiceKaplan appears in Epic so I suspect [facility].  Getting hepatitis A series.     Review of Systems  Constitutional: Negative for appetite change and fatigue.  Gastrointestinal: Negative for diarrhea.  Skin: Negative for rash.  Neurological: Negative for dizziness and headaches.       Objective:   Physical Exam  Constitutional: He appears well-developed and well-nourished. No distress.  Eyes: No scleral icterus.  Cardiovascular: Normal rate, regular rhythm and normal heart sounds.   Pulmonary/Chest: Effort normal and breath sounds normal. No respiratory distress.  Skin: No rash noted.   SHX: no alcohol, no smoking       Assessment & Plan:

## 2016-03-14 LAB — HEPATITIS C RNA QUANTITATIVE: HCV Quantitative: NOT DETECTED IU/mL (ref <15)

## 2016-03-15 ENCOUNTER — Ambulatory Visit (HOSPITAL_COMMUNITY)
Admission: EM | Admit: 2016-03-15 | Discharge: 2016-03-15 | Disposition: A | Discharge status: Home / Self Care | Payer: BLUE CROSS/BLUE SHIELD | Attending: Internal Medicine | Admitting: Internal Medicine

## 2016-03-15 ENCOUNTER — Encounter (HOSPITAL_COMMUNITY): Payer: Self-pay | Admitting: Emergency Medicine

## 2016-03-15 DIAGNOSIS — R030 Elevated blood-pressure reading, without diagnosis of hypertension: Secondary | ICD-10-CM | POA: Diagnosis not present

## 2016-03-15 DIAGNOSIS — R0981 Nasal congestion: Secondary | ICD-10-CM

## 2016-03-15 MED ORDER — MOMETASONE FUROATE 50 MCG/ACT NA SUSP: 2.0000 | Freq: Every day | NASAL | 12 refills | Status: DC

## 2016-03-15 NOTE — Discharge Instructions (Signed)
Nice to meet you. Your blood pressure was most likely high in the setting of decongestant use. Please stop allegra D and no further use of any decongestants. Allegra alone is ok with use of nasal spray is appropriate. Keep a check of your BP, we are hopeful this will normalize off the medications, but highly recommend a PCP for routine care and f/u. Feel better.

## 2016-03-15 NOTE — ED Triage Notes (Signed)
Pt reports BP was about 191/118 about an hour ago when checked it at [facility] associated w/intermittent HAs  Denies CP, n/v  A&O x4... NAD

## 2016-03-15 NOTE — ED Provider Notes (Signed)
CSN: [id number]     Arrival date & time 03/15/16  1949 History   First MD Initiated Contact with Patient 03/15/16 1959     Chief Complaint  Patient presents with  . Hypertension   (Consider location/radiation/quality/duration/timing/severity/associated sxs/prior Treatment) 60 yo very pleasant black male presents with an episode of elevated BP without a known history of HTN. Patient received the Pneumovax on Tuesday. Following this he began to have nasal congestion and some fatigue. He started treating his symptoms with Allegra D daily and then started having headaches and checked his BP which was high as noted in nurse notes. He was worried and presented here. He states that his headache has subsided. He still feels congested. He has no fevers, chest pain or dizziness.        Past Medical History:  Diagnosis Date  . Hepatitis C    History reviewed. No pertinent surgical history. Family History  Problem Relation Age of Onset  . Hypertension Sister    Social History  Substance Use Topics  . Smoking status: Former Games developermoker  . Smokeless tobacco: Never Used  . Alcohol use No    Review of Systems  Constitutional: Positive for fatigue.  HENT: Positive for congestion. Negative for sinus pain, sinus pressure and sore throat.   Respiratory: Negative.   Skin: Negative.   Neurological: Negative for dizziness, light-headedness and headaches.  Psychiatric/Behavioral: Negative.     Allergies  Patient has no known allergies.  Home Medications   Prior to Admission medications   Medication Sig Start Date End Date Taking? Authorizing Provider  mometasone (NASONEX) 50 MCG/ACT nasal spray Place 2 sprays into the nose daily. 03/15/16   Riki SheerMichelle G Lataisha Colan, PA-C   Meds Ordered and Administered this Visit  Medications - No data to display  BP 150/94 (BP Location: Left Arm)   Pulse 75   Temp 98.6 F (37 C) (Oral)   Resp 20   SpO2 98%  No data found.   Physical Exam  Constitutional: He  is oriented to person, place, and time. He appears well-developed and well-nourished. No distress.  HENT:  Right Ear: External ear normal.  Left Ear: External ear normal.  Nose: Nose normal.  Mouth/Throat: Oropharynx is clear and moist.  Cardiovascular: Normal rate and regular rhythm.   Pulmonary/Chest: Effort normal and breath sounds normal.  Lymphadenopathy:    He has no cervical adenopathy.  Neurological: He is alert and oriented to person, place, and time.  Skin: Skin is warm and dry. He is not diaphoretic.  Psychiatric: His behavior is normal.  Nursing note and vitals reviewed.   Urgent Care Course   Clinical Course     Procedures (including critical care time)  Labs Review Labs Reviewed - No data to display  Imaging Review No results found.   Visual Acuity Review  Right Eye Distance:   Left Eye Distance:   Bilateral Distance:    Right Eye Near:   Left Eye Near:    Bilateral Near:         MDM   1. Elevated blood pressure reading   2. Nasal congestion    1. Most likely in the setting of Decongestant use trying to alleviate nasal symptoms. Stop the Allegra D. Make a routine physical with PCP, watch salt in interim and monitor. If concerns f/u.  2. Can take Allegra alone without decongestant use. RX given for Flonase as well which is safe    Riki SheerMichelle G Malone Admire, PA-C 03/15/16 2036

## 2016-05-09 ENCOUNTER — Encounter: Payer: Self-pay | Admitting: Gastroenterology

## 2016-05-13 ENCOUNTER — Encounter: Payer: Self-pay | Admitting: Internal Medicine

## 2016-06-06 ENCOUNTER — Encounter (HOSPITAL_COMMUNITY): Payer: Self-pay | Admitting: Emergency Medicine

## 2016-06-06 ENCOUNTER — Ambulatory Visit (HOSPITAL_COMMUNITY)
Admission: EM | Admit: 2016-06-06 | Discharge: 2016-06-06 | Disposition: A | Discharge status: Home / Self Care | Payer: BLUE CROSS/BLUE SHIELD | Attending: Internal Medicine | Admitting: Internal Medicine

## 2016-06-06 DIAGNOSIS — J069 Acute upper respiratory infection, unspecified: Secondary | ICD-10-CM

## 2016-06-06 MED ORDER — AZITHROMYCIN 250 MG PO TABS
250.0000 mg | ORAL_TABLET | Freq: Every day | ORAL | 0 refills | Status: DC
Start: 2016-06-06 — End: 2016-08-13

## 2016-06-06 MED ORDER — BENZONATATE 100 MG PO CAPS: 100.0000 mg | ORAL_CAPSULE | Freq: Three times a day (TID) | ORAL | 0 refills | Status: DC

## 2016-06-06 NOTE — ED Provider Notes (Signed)
CSN: [id number]     Arrival date & time 06/06/16  1835 History   First MD Initiated Contact with Patient 06/06/16 1858     Chief Complaint  Patient presents with  . Cough   (Consider location/radiation/quality/duration/timing/severity/associated sxs/prior Treatment) HPI  Gregory Holloway is a 61 y.o. male presenting to UC with c/o 2 weeks of gradually worsening productive cough, subjective fever earlier on and mild sore throat.  He has tried OTC cough/cold medications with mild temporary relief. Denies n/v/d. Denies chest pain or SOB. No hx of asthma.  Pt notes his grandson was dx with the flu about 2 weeks ago, which is when pt's symptoms started.    Past Medical History:  Diagnosis Date  . Hepatitis C    History reviewed. No pertinent surgical history. Family History  Problem Relation Age of Onset  . Hypertension Sister    Social History  Substance Use Topics  . Smoking status: Former Games developermoker  . Smokeless tobacco: Never Used  . Alcohol use No    Review of Systems  Constitutional: Positive for fever. Negative for chills.  HENT: Positive for congestion, rhinorrhea and sore throat. Negative for ear pain, trouble swallowing and voice change.   Respiratory: Positive for cough. Negative for shortness of breath.   Cardiovascular: Negative for chest pain and palpitations.  Gastrointestinal: Negative for abdominal pain, diarrhea, nausea and vomiting.  Musculoskeletal: Negative for arthralgias, back pain and myalgias.  Skin: Negative for rash.  Neurological: Negative for dizziness, light-headedness and headaches.    Allergies  Patient has no known allergies.  Home Medications   Prior to Admission medications   Medication Sig Start Date End Date Taking? Authorizing Provider  azithromycin (ZITHROMAX) 250 MG tablet Take 1 tablet (250 mg total) by mouth daily. Take first 2 tablets together, then 1 every day until finished. 06/06/16   Junius FinnerErin O'Malley, PA-C  benzonatate (TESSALON) 100 MG  capsule Take 1 capsule (100 mg total) by mouth every 8 (eight) hours. 06/06/16   Junius FinnerErin O'Malley, PA-C  mometasone (NASONEX) 50 MCG/ACT nasal spray Place 2 sprays into the nose daily. 03/15/16   Riki SheerMichelle G Young, PA-C   Meds Ordered and Administered this Visit  Medications - No data to display  BP 141/92 (BP Location: Left Arm)   Pulse 81   Temp 98.4 F (36.9 C) (Oral)   SpO2 98%  No data found.   Physical Exam  Constitutional: He is oriented to person, place, and time. He appears well-developed and well-nourished. No distress.  HENT:  Head: Normocephalic and atraumatic.  Right Ear: Tympanic membrane normal.  Left Ear: Tympanic membrane normal.  Nose: Nose normal.  Mouth/Throat: Uvula is midline, oropharynx is clear and moist and mucous membranes are normal.  Eyes: EOM are normal.  Neck: Normal range of motion. Neck supple.  Cardiovascular: Normal rate and regular rhythm.   Pulmonary/Chest: Effort normal and breath sounds normal. No stridor. No respiratory distress. He has no wheezes. He has no rales.  Musculoskeletal: Normal range of motion.  Lymphadenopathy:    He has no cervical adenopathy.  Neurological: He is alert and oriented to person, place, and time.  Skin: Skin is warm and dry. He is not diaphoretic.  Psychiatric: He has a normal mood and affect. His behavior is normal.  Nursing note and vitals reviewed.   Urgent Care Course     Procedures (including critical care time)  Labs Review Labs Reviewed - No data to display  Imaging Review No results found.  MDM   1. Upper respiratory tract infection, unspecified type    Pt c/o 2 weeks of worsening productive cough. Due to duration of symptoms will cover for atypical bacterial infection Rx: azithromycin and tessalon  F/u with PCP in 1 week if not improving. Sooner if worsening.     Junius Finner, PA-C 06/06/16 1928

## 2016-06-06 NOTE — ED Triage Notes (Signed)
Pt has had a cough for two weeks.  He has tried OTC medications with no relief.

## 2016-07-30 ENCOUNTER — Other Ambulatory Visit: Payer: BLUE CROSS/BLUE SHIELD

## 2016-08-13 ENCOUNTER — Ambulatory Visit (INDEPENDENT_AMBULATORY_CARE_PROVIDER_SITE_OTHER): Payer: BLUE CROSS/BLUE SHIELD | Admitting: Internal Medicine

## 2016-08-13 ENCOUNTER — Telehealth: Payer: Self-pay | Admitting: *Deleted

## 2016-08-13 VITALS — Temp 97.7°F | Ht 67.0 in | Wt 176.0 lb

## 2016-08-13 DIAGNOSIS — K7460 Unspecified cirrhosis of liver: Secondary | ICD-10-CM | POA: Diagnosis not present

## 2016-08-13 DIAGNOSIS — Z7185 Encounter for immunization safety counseling: Secondary | ICD-10-CM

## 2016-08-13 DIAGNOSIS — Z23 Encounter for immunization: Secondary | ICD-10-CM

## 2016-08-13 DIAGNOSIS — B182 Chronic viral hepatitis C: Secondary | ICD-10-CM | POA: Diagnosis not present

## 2016-08-13 DIAGNOSIS — Z7189 Other specified counseling: Secondary | ICD-10-CM | POA: Diagnosis not present

## 2016-08-13 LAB — COMPLETE METABOLIC PANEL WITH GFR
ALT: 15 U/L (ref 9–46)
AST: 16 U/L (ref 10–35)
Albumin: 3.9 g/dL (ref 3.6–5.1)
Alkaline Phosphatase: 48 U/L (ref 40–115)
BUN: 14 mg/dL (ref 7–25)
CHLORIDE: 107 mmol/L (ref 98–110)
CO2: 26 mmol/L (ref 20–31)
Calcium: 8.9 mg/dL (ref 8.6–10.3)
Creat: 1.22 mg/dL (ref 0.70–1.25)
GFR, EST AFRICAN AMERICAN: 74 mL/min (ref >=60)
GFR, EST NON AFRICAN AMERICAN: 64 mL/min (ref >=60)
GLUCOSE: 108 mg/dL — AB (ref 65–99)
POTASSIUM: 4.1 mmol/L (ref 3.5–5.3)
SODIUM: 140 mmol/L (ref 135–146)
Total Bilirubin: 0.7 mg/dL (ref 0.2–1.2)
Total Protein: 6.6 g/dL (ref 6.1–8.1)

## 2016-08-13 NOTE — Progress Notes (Signed)
   Subjective:    Patient ID: Gregory Holloway, male    DOB: [birth year], 61 y.o.   MRN: [id number]  HPI Here for follow up of HCV. Completed 8 weeks of Harvoni and here for SVR 24.  No new issues.  I referred him to GI for ? EGD but he did not respond to their calls.  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  Getting hepatitis A series.  No new complaints.  No associated jaundice.     Review of Systems  Constitutional: Negative for fatigue.  Gastrointestinal: Negative for abdominal pain.  Neurological: Negative for dizziness.       Objective:   Physical Exam  Constitutional: He appears well-developed and well-nourished. No distress.  Eyes: No scleral icterus.  Cardiovascular: Normal rate, regular rhythm and normal heart sounds.   No murmur heard. Skin: No rash noted.    SH: no alcohol     Assessment & Plan:

## 2016-08-13 NOTE — Assessment & Plan Note (Signed)
Svr 24 today.  If negative, will be considered cured.

## 2016-08-13 NOTE — Assessment & Plan Note (Signed)
Received hepatitis A #2 today.  Counseled

## 2016-08-13 NOTE — Addendum Note (Signed)
Addended by: Wendall MolaOCKERHAM, Debera Sterba A on: 08/13/2016 11:32 AM   Modules accepted: Orders

## 2016-08-13 NOTE — Telephone Encounter (Signed)
Called and spoke to patient to inform [facility]. He is not sure if he can make it at that time and I gave him the number to reschedule. He will need to have follow up ultrasounds ever six months going forward. Wendall MolaJacqueline Cockerham CMA

## 2016-08-13 NOTE — Assessment & Plan Note (Addendum)
HCC screening with ultrasound now and every 6 months.   He can rtc in 1 year with me unless concerns noted on ultrasound

## 2016-08-14 ENCOUNTER — Encounter: Payer: Self-pay | Admitting: Gastroenterology

## 2016-08-15 LAB — HEPATITIS C RNA QUANTITATIVE
HCV QUANT LOG: NOT DETECTED {Log_IU}/mL
HCV Quantitative: <15 IU/mL

## 2016-08-23 ENCOUNTER — Other Ambulatory Visit: Payer: BLUE CROSS/BLUE SHIELD

## 2016-08-30 ENCOUNTER — Ambulatory Visit
Admission: RE | Admit: 2016-08-30 | Discharge: 2016-08-30 | Disposition: A | Discharge status: Home / Self Care | Payer: BLUE CROSS/BLUE SHIELD | Source: Ambulatory Visit | Attending: Internal Medicine | Admitting: Internal Medicine

## 2016-08-30 DIAGNOSIS — K7460 Unspecified cirrhosis of liver: Secondary | ICD-10-CM

## 2016-09-20 ENCOUNTER — Ambulatory Visit (AMBULATORY_SURGERY_CENTER): Payer: Self-pay | Admitting: *Deleted

## 2016-09-20 VITALS — Ht 67.0 in | Wt 179.0 lb

## 2016-09-20 DIAGNOSIS — Z1211 Encounter for screening for malignant neoplasm of colon: Secondary | ICD-10-CM

## 2016-09-20 MED ORDER — NA SULFATE-K SULFATE-MG SULF 17.5-3.13-1.6 GM/177ML PO SOLN: 1.0000 | Freq: Once | ORAL | 0 refills | Status: AC

## 2016-09-20 NOTE — Progress Notes (Signed)
No egg or soy allergy known to patient  No issues with past sedation with any surgeries  or procedures, no intubation problems  No diet pills per patient No home 02 use per patient  No blood thinners per patient  Pt denies issues with constipation  No A fib or A flutter  EMMI video sent to pt's e mail  

## 2016-09-23 ENCOUNTER — Encounter: Payer: Self-pay | Admitting: Gastroenterology

## 2016-10-01 ENCOUNTER — Telehealth: Payer: Self-pay | Admitting: Gastroenterology

## 2016-10-01 NOTE — Telephone Encounter (Signed)
Called pt.  Suprep sample labeled and left for pt with 4th floor receptionist. Gregory Holloway/PV

## 2016-10-07 ENCOUNTER — Ambulatory Visit (AMBULATORY_SURGERY_CENTER): Payer: BLUE CROSS/BLUE SHIELD | Admitting: Gastroenterology

## 2016-10-07 ENCOUNTER — Encounter: Payer: Self-pay | Admitting: Gastroenterology

## 2016-10-07 VITALS — BP 115/77 | HR 64 | Temp 97.5°F | Resp 11 | Ht 67.0 in | Wt 176.0 lb

## 2016-10-07 DIAGNOSIS — Z1212 Encounter for screening for malignant neoplasm of rectum: Secondary | ICD-10-CM | POA: Diagnosis not present

## 2016-10-07 DIAGNOSIS — Z1211 Encounter for screening for malignant neoplasm of colon: Secondary | ICD-10-CM

## 2016-10-07 MED ORDER — SODIUM CHLORIDE 0.9 % IV SOLN: 500.0000 mL | INTRAVENOUS | Status: DC

## 2016-10-07 NOTE — Patient Instructions (Signed)
YOU HAD AN ENDOSCOPIC PROCEDURE TODAY AT THE [facility]:   Refer to the procedure report that was given to you for any specific questions about what was found during the examination.  If the procedure report does not answer your questions, please call your gastroenterologist to clarify.  If you requested that your care partner not be given the details of your procedure findings, then the procedure report has been included in a sealed envelope for you to review at your convenience later.  YOU SHOULD EXPECT: Some feelings of bloating in the abdomen. Passage of more gas than usual.  Walking can help get rid of the air that was put into your GI tract during the procedure and reduce the bloating. If you had a lower endoscopy (such as a colonoscopy or flexible sigmoidoscopy) you may notice spotting of blood in your stool or on the toilet paper. If you underwent a bowel prep for your procedure, you may not have a normal bowel movement for a few days.  Please Note:  You might notice some irritation and congestion in your nose or some drainage.  This is from the oxygen used during your procedure.  There is no need for concern and it should clear up in a day or so.  SYMPTOMS TO REPORT IMMEDIATELY:   Following lower endoscopy (colonoscopy or flexible sigmoidoscopy):  Excessive amounts of blood in the stool  Significant tenderness or worsening of abdominal pains  Swelling of the abdomen that is new, acute  Fever of 100F or higher   For urgent or emergent issues, a gastroenterologist can be reached at any hour by calling [phone].   DIET:  We do recommend a small meal at first, but then you may proceed to your regular diet.  Drink plenty of fluids but you should avoid alcoholic beverages for 24 hours.  ACTIVITY:  You should plan to take it easy for the rest of today and you should NOT DRIVE or use heavy machinery until tomorrow (because of the sedation medicines used during the test).     FOLLOW UP: Our staff will call the number listed on your records the next business day following your procedure to check on you and address any questions or concerns that you may have regarding the information given to you following your procedure. If we do not reach you, we will leave a message.  However, if you are feeling well and you are not experiencing any problems, there is no need to return our call.  We will assume that you have returned to your regular daily activities without incident.  If any biopsies were taken you will be contacted by phone or by letter within the next 1-3 weeks.  Please call us at [phone] if you have not heard about the biopsies in 3 weeks.   Repeat Colonoscopy in 10 years    SIGNATURES/CONFIDENTIALITY: You and/or your care partner have signed paperwork which will be entered into your electronic medical record.  These signatures attest to the fact that that the information above on your After Visit Summary has been reviewed and is understood.  Full responsibility of the confidentiality of this discharge information lies with you and/or your care-partner. 

## 2016-10-07 NOTE — Progress Notes (Signed)
To PACU, vss patent aw report to rn 

## 2016-10-07 NOTE — Op Note (Signed)
[facility] Patient Name: Gregory BumpersHarold Holloway Procedure Date: 10/07/2016 2:39 PM MRN: [id number] Endoscopist: Sherilyn CooterHenry L. Myrtie Neitheranis , MD Age: [age] Referring MD:  Date of Birth: Feb 25, 1956 Gender: Male Account #: [id number] Procedure:                Colonoscopy Indications:              Screening for colorectal malignant neoplasm (normal                            colonoscopy in 2008) Medicines:                Monitored Anesthesia Care Procedure:                Pre-Anesthesia Assessment:                           - Prior to the procedure, a History and Physical                            was performed, and patient medications and                            allergies were reviewed. The patient's tolerance of                            previous anesthesia was also reviewed. The risks                            and benefits of the procedure and the sedation                            options and risks were discussed with the patient.                            All questions were answered, and informed consent                            was obtained. Prior Anticoagulants: The patient has                            taken no previous anticoagulant or antiplatelet                            agents. ASA Grade Assessment: I - A normal, healthy                            patient. After reviewing the risks and benefits,                            the patient was deemed in satisfactory condition to                            undergo the procedure.  After obtaining informed consent, the colonoscope                            was passed under direct vision. Throughout the                            procedure, the patient's blood pressure, pulse, and                            oxygen saturations were monitored continuously. The                            Colonoscope was introduced through the anus and                            advanced to the the cecum, identified by               appendiceal orifice and ileocecal valve. The                            colonoscopy was performed without difficulty. The                            patient tolerated the procedure well. The quality                            of the bowel preparation was excellent. The                            ileocecal valve, appendiceal orifice, and rectum                            were photographed. The quality of the bowel                            preparation was evaluated using the BBPS [facility]                            Bowel Preparation Scale) with scores of: Right                            Colon = 3, Transverse Colon = 3 and Left Colon = 3                            (entire mucosa seen well with no residual staining,                            small fragments of stool or opaque liquid). The                            total BBPS score equals 9. The bowel preparation  used was SUPREP. Scope In: 2:45:23 PM Scope Out: 2:57:19 PM Scope Withdrawal Time: 0 hours 8 minutes 39 seconds  Total Procedure Duration: 0 hours 11 minutes 56 seconds  Findings:                 The perianal and digital rectal examinations were                            normal.                           The entire examined colon appeared normal on direct                            and retroflexion views. Complications:            No immediate complications. Estimated Blood Loss:     Estimated blood loss: none. Impression:               - The entire examined colon is normal on direct and                            retroflexion views.                           - No specimens collected. Recommendation:           - Patient has a contact number available for                            emergencies. The signs and symptoms of potential                            delayed complications were discussed with the                            patient. Return to normal activities tomorrow.                             Written discharge instructions were provided to the                            patient.                           - Resume previous diet.                           - Continue present medications.                           - Repeat colonoscopy in 10 years for screening                            purposes. [name] L. Myrtie Neither, MD 10/07/2016 3:11:02 PM This report has been signed electronically.

## 2016-10-07 NOTE — Progress Notes (Signed)
Pt. Reports no change in his medical or surgical history since pre-visit 09/20/2016.   Pt. Did wake up with right shoulder pain/stiffness this morning.

## 2016-10-08 ENCOUNTER — Telehealth: Payer: Self-pay | Admitting: *Deleted

## 2016-10-08 NOTE — Telephone Encounter (Signed)
  Follow up Call-  Call back number 10/07/2016  Post procedure Call Back phone  # [phone]  Permission to leave phone message Yes  Some recent data might be hidden     Patient questions:  Do you have a fever, pain , or abdominal swelling? No. Pain Score  0 *  Have you tolerated food without any problems? Yes.    Have you been able to return to your normal activities? Yes.    Do you have any questions about your discharge instructions: Diet   No. Medications  No. Follow up visit  No.  Do you have questions or concerns about your Care? No.  Actions: * If pain score is 4 or above: No action needed, pain <4.

## 2016-11-09 ENCOUNTER — Ambulatory Visit (INDEPENDENT_AMBULATORY_CARE_PROVIDER_SITE_OTHER): Payer: BLUE CROSS/BLUE SHIELD | Admitting: Family Medicine

## 2016-11-09 ENCOUNTER — Encounter: Payer: Self-pay | Admitting: Family Medicine

## 2016-11-09 VITALS — BP 160/88 | HR 60 | Temp 97.8°F | Resp 16 | Ht 65.5 in | Wt 175.4 lb

## 2016-11-09 DIAGNOSIS — Z0000 Encounter for general adult medical examination without abnormal findings: Secondary | ICD-10-CM | POA: Diagnosis not present

## 2016-11-09 DIAGNOSIS — Z131 Encounter for screening for diabetes mellitus: Secondary | ICD-10-CM

## 2016-11-09 DIAGNOSIS — Z23 Encounter for immunization: Secondary | ICD-10-CM | POA: Diagnosis not present

## 2016-11-09 DIAGNOSIS — R030 Elevated blood-pressure reading, without diagnosis of hypertension: Secondary | ICD-10-CM

## 2016-11-09 DIAGNOSIS — Z125 Encounter for screening for malignant neoplasm of prostate: Secondary | ICD-10-CM

## 2016-11-09 DIAGNOSIS — K7460 Unspecified cirrhosis of liver: Secondary | ICD-10-CM | POA: Diagnosis not present

## 2016-11-09 DIAGNOSIS — Z13220 Encounter for screening for lipoid disorders: Secondary | ICD-10-CM | POA: Diagnosis not present

## 2016-11-09 DIAGNOSIS — B182 Chronic viral hepatitis C: Secondary | ICD-10-CM

## 2016-11-09 DIAGNOSIS — J301 Allergic rhinitis due to pollen: Secondary | ICD-10-CM

## 2016-11-09 LAB — POCT URINALYSIS DIP (MANUAL ENTRY)
BILIRUBIN UA: NEGATIVE
BILIRUBIN UA: NEGATIVE mg/dL
Blood, UA: NEGATIVE
GLUCOSE UA: NEGATIVE mg/dL
Leukocytes, UA: NEGATIVE
Nitrite, UA: NEGATIVE
Protein Ur, POC: NEGATIVE mg/dL
SPEC GRAV UA: 1.025 (ref 1.010–1.025)
UROBILINOGEN UA: 0.2 U/dL
pH, UA: 5.5 (ref 5.0–8.0)

## 2016-11-09 MED ORDER — ZOSTER VAC RECOMB ADJUVANTED 50 MCG/0.5ML IM SUSR: 0.5000 mL | Freq: Once | INTRAMUSCULAR | 1 refills | Status: AC

## 2016-11-09 NOTE — Patient Instructions (Addendum)
   IF you received an x-ray today, you will receive an invoice from [facility]. Please contact [facility] at [phone] with questions or concerns regarding your invoice.   IF you received labwork today, you will receive an invoice from [facility]. Please contact [facility] at [phone] with questions or concerns regarding your invoice.   Our billing staff will not be able to assist you with questions regarding bills from these companies.  You will be contacted with the lab results as soon as they are available. The fastest way to get your results is to activate your My Chart account. Instructions are located on the last page of this paperwork. If you have not heard from us regarding the results in 2 weeks, please contact this office.     Preventive Care 40-64 Years, Male Preventive care refers to lifestyle choices and visits with your health care provider that can promote health and wellness. What does preventive care include?  A yearly physical exam. This is also called an annual well check.  Dental exams once or twice a year.  Routine eye exams. Ask your health care provider how often you should have your eyes checked.  Personal lifestyle choices, including: ? Daily care of your teeth and gums. ? Regular physical activity. ? Eating a healthy diet. ? Avoiding tobacco and drug use. ? Limiting alcohol use. ? Practicing safe sex. ? Taking low-dose aspirin every day starting at age 50. What happens during an annual well check? The services and screenings done by your health care provider during your annual well check will depend on your age, overall health, lifestyle risk factors, and family history of disease. Counseling Your health care provider may ask you questions about your:  Alcohol use.  Tobacco use.  Drug use.  Emotional well-being.  Home and relationship well-being.  Sexual activity.  Eating habits.  Work and work  environment.  Screening You may have the following tests or measurements:  Height, weight, and BMI.  Blood pressure.  Lipid and cholesterol levels. These may be checked every 5 years, or more frequently if you are over 50 years old.  Skin check.  Lung cancer screening. You may have this screening every year starting at age 55 if you have a 30-pack-year history of smoking and currently smoke or have quit within the past 15 years.  Fecal occult blood test (FOBT) of the stool. You may have this test every year starting at age 50.  Flexible sigmoidoscopy or colonoscopy. You may have a sigmoidoscopy every 5 years or a colonoscopy every 10 years starting at age 50.  Prostate cancer screening. Recommendations will vary depending on your family history and other risks.  Hepatitis C blood test.  Hepatitis B blood test.  Sexually transmitted disease (STD) testing.  Diabetes screening. This is done by checking your blood sugar (glucose) after you have not eaten for a while (fasting). You may have this done every 1-3 years.  Discuss your test results, treatment options, and if necessary, the need for more tests with your health care provider. Vaccines Your health care provider may recommend certain vaccines, such as:  Influenza vaccine. This is recommended every year.  Tetanus, diphtheria, and acellular pertussis (Tdap, Td) vaccine. You may need a Td booster every 10 years.  Varicella vaccine. You may need this if you have not been vaccinated.  Zoster vaccine. You may need this after age 60.  Measles, mumps, and rubella (MMR) vaccine. You may need at least one dose of   MMR if you were born in 1957 or later. You may also need a second dose.  Pneumococcal 13-valent conjugate (PCV13) vaccine. You may need this if you have certain conditions and have not been vaccinated.  Pneumococcal polysaccharide (PPSV23) vaccine. You may need one or two doses if you smoke cigarettes or if you have  certain conditions.  Meningococcal vaccine. You may need this if you have certain conditions.  Hepatitis A vaccine. You may need this if you have certain conditions or if you travel or work in places where you may be exposed to hepatitis A.  Hepatitis B vaccine. You may need this if you have certain conditions or if you travel or work in places where you may be exposed to hepatitis B.  Haemophilus influenzae type b (Hib) vaccine. You may need this if you have certain risk factors.  Talk to your health care provider about which screenings and vaccines you need and how often you need them. This information is not intended to replace advice given to you by your health care provider. Make sure you discuss any questions you have with your health care provider. Document Released: 04/14/2015 Document Revised: 12/06/2015 Document Reviewed: 01/17/2015 Elsevier Interactive Patient Education  2017 [employer].

## 2016-11-09 NOTE — Progress Notes (Signed)
Subjective:    Patient ID: Gregory Holloway, male    DOB: [birth year], 61 y.o.   MRN: [id number]  11/09/2016  Annual Exam   HPI This 61 y.o. male presents for evaluation for Complete Physical Examination.  Last physical:  09-16-15 Colonoscopy:  10-07-2016 PSA: Eye exam:  Glasses; 06/2016;  Dental exam:  Dentures   Visual Acuity Screening   Right eye Left eye Both eyes  Without correction:     With correction: 20/20 20/20 20/20      BP Readings from Last 3 Encounters:  11/09/16 (!) 160/88  10/07/16 115/77  06/06/16 141/92   Wt Readings from Last 3 Encounters:  11/09/16 175 lb 6.4 oz (79.6 kg)  10/07/16 176 lb (79.8 kg)  09/20/16 179 lb (81.2 kg)   Immunization History  Administered Date(s) Administered  . Hepatitis A, Adult 11/01/2015, 08/13/2016  . Pneumococcal Polysaccharide-23 03/12/2016  . Tdap 11/27/2013  . Zoster 09/16/2015    Review of Systems  Constitutional: Negative for activity change, appetite change, chills, diaphoresis, fatigue, fever and unexpected weight change.  HENT: Negative for congestion, dental problem, drooling, ear discharge, ear pain, facial swelling, hearing loss, mouth sores, nosebleeds, postnasal drip, rhinorrhea, sinus pressure, sneezing, sore throat, tinnitus, trouble swallowing and voice change.   Eyes: Negative for photophobia, pain, discharge, redness, itching and visual disturbance.  Respiratory: Negative for apnea, cough, choking, chest tightness, shortness of breath, wheezing and stridor.   Cardiovascular: Negative for chest pain, palpitations and leg swelling.  Gastrointestinal: Negative for abdominal pain, blood in stool, constipation, diarrhea, nausea and vomiting.  Endocrine: Negative for cold intolerance, heat intolerance, polydipsia, polyphagia and polyuria.  Genitourinary: Negative for decreased urine volume, difficulty urinating, discharge, dysuria, enuresis, flank pain, frequency, genital sores, hematuria, penile pain, penile  swelling, scrotal swelling, testicular pain and urgency.       Nocturia x 0-1.  Slightly weaker stream.   Musculoskeletal: Negative for arthralgias, back pain, gait problem, joint swelling, myalgias, neck pain and neck stiffness.  Skin: Negative for color change, pallor, rash and wound.  Allergic/Immunologic: Negative for environmental allergies, food allergies and immunocompromised state.  Neurological: Negative for dizziness, tremors, seizures, syncope, facial asymmetry, speech difficulty, weakness, light-headedness, numbness and headaches.  Hematological: Negative for adenopathy. Does not bruise/bleed easily.  Psychiatric/Behavioral: Negative for agitation, behavioral problems, confusion, decreased concentration, dysphoric mood, hallucinations, self-injury, sleep disturbance and suicidal ideas. The patient is not nervous/anxious and is not hyperactive.        Bedtime 1000; wakes up 500.    Past Medical History:  Diagnosis Date  . Allergy    Nasonex  . Hepatitis C    Past Surgical History:  Procedure Laterality Date  . broken nose    . COLONOSCOPY  2008  . finegr ligament repair     No Known Allergies Current Outpatient Prescriptions  Medication Sig Dispense Refill  . CALCIUM MAGNESIUM 750 PO Take by mouth daily.    . mometasone (NASONEX) 50 MCG/ACT nasal spray Place 2 sprays into the nose daily. (Patient taking differently: Place 2 sprays into the nose daily as needed. ) 17 g 12   No current facility-administered medications for this visit.    Social History   Social History  . Marital status: Married    Spouse name: N/A  . Number of children: 6  . Years of education: 20   Occupational History  . truck driver    Social History Main Topics  . Smoking status: Former Smoker    Types: Cigarettes  .  Smokeless tobacco: Never Used     Comment: quit 20+ yrs ago   . Alcohol use No  . Drug use: No  . Sexual activity: Not Currently   Other Topics Concern  . 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      Tobacco: none; quit 26 years ago.      Alcohol: none      Drugs: none       Exercise: none      Seatbelt: 100%; no texting            Family History  Problem Relation Age of Onset  . Hypertension Mother   . Hypertension Father   . Hypertension Sister   . Hypertension Brother   . Colon cancer Neg Hx   . Colon polyps Neg Hx   . Esophageal cancer Neg Hx   . Rectal cancer Neg Hx   . Stomach cancer Neg Hx        Objective:    BP (!) 160/88   Pulse 60   Temp 97.8 F (36.6 C) (Oral)   Resp 16   Ht 5' 5.5" (1.664 m)   Wt 175 lb 6.4 oz (79.6 kg)   SpO2 98%   BMI 28.74 kg/m  Physical Exam  Constitutional: He is oriented to person, place, and time. He appears well-developed and well-nourished. No distress.  HENT:  Head: Normocephalic and atraumatic.  Right Ear: External ear normal.  Left Ear: External ear normal.  Nose: Nose normal.  Mouth/Throat: Oropharynx is clear and moist.  Eyes: Pupils are equal, round, and reactive to light. Conjunctivae and EOM are normal.  Neck: Normal range of motion. Neck supple. Carotid bruit is not present. No thyromegaly present.  Cardiovascular: Normal rate, regular rhythm, normal heart sounds and intact distal pulses.  Exam reveals no gallop and no friction rub.   No murmur heard. Pulmonary/Chest: Effort normal and breath sounds normal. He has no wheezes. He has no rales.  Abdominal: Soft. Bowel sounds are normal. He exhibits no distension and no mass. There is no tenderness. There is no rebound and no guarding. Hernia confirmed negative in the right inguinal area and confirmed negative in the left inguinal area.  Genitourinary: Rectum normal, prostate normal, testes normal and penis normal.  Musculoskeletal:       Right shoulder: Normal.        Left shoulder: Normal.       Cervical back: Normal.  Lymphadenopathy:    He has no cervical adenopathy.       Right: No inguinal adenopathy present.       Left: No inguinal adenopathy present.  Neurological: He is alert and oriented to person, place, and time. He has normal reflexes. No cranial nerve deficit. He exhibits normal muscle tone. Coordination normal.  Skin: Skin is warm and dry. No rash noted. He is not diaphoretic.  Psychiatric: He has a normal mood and affect. His behavior is normal. Judgment and thought content normal.    No results found. [facility] 11/09/2016 08/13/2016 11/01/2015 09/16/2015  Decreased Interest 0 0 0 0  Down, Depressed, Hopeless 0 0 0 0  PHQ - 2 Score 0 0 0 0   Fall Risk  11/09/2016 08/13/2016 03/12/2016 11/01/2015  09/16/2015  Falls in the past year? No No No No No        Assessment & Plan:   1. Routine physical examination   2. Seasonal allergic rhinitis due to pollen   3. Chronic hepatitis C without hepatic coma (HCC)   4. Cirrhosis of liver without ascites, unspecified hepatic cirrhosis type (HCC)   5. Screening for diabetes mellitus   6. Screening, lipid   7. Screening for prostate cancer   8. Blood pressure elevated without history of HTN   9. Need for shingles vaccine    -anticipatory guidance provided --- exercise, weight loss, safe driving practices, aspirin 81mg  daily. -obtain age appropriate screening labs and labs for chronic disease management. 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   Orders Placed This Encounter  Procedures  . CBC with Differential/Platelet  . Comprehensive metabolic panel    Order Specific Question:   Has the patient fasted?    Answer:   Yes  . Hemoglobin A1c  . Lipid panel    Order Specific Question:   Has the patient fasted?    Answer:   Yes  . TSH  . PSA  . Care  order/instruction:    Please recheck BP.  Marland Kitchen POCT urinalysis dipstick  . EKG 12-Lead   Meds ordered this encounter  Medications  . Zoster Vac Recomb Adjuvanted [facility]) injection    Sig: Inject 0.5 mLs into the muscle once.    Dispense:  0.5 mL    Refill:  1    No Follow-up on file.   Koralynn Greenspan Paulita Fujita, M.D. Primary Care at [facility] previously [facility] [address] [place], [place]  [address] [phone] phone [phone] fax

## 2016-11-10 LAB — CBC WITH DIFFERENTIAL/PLATELET
BASOS ABS: 0.1 10*3/uL (ref 0.0–0.2)
Basos: 1 %
EOS (ABSOLUTE): 0.1 10*3/uL (ref 0.0–0.4)
Eos: 2 %
Hematocrit: 42 % (ref 37.5–51.0)
Hemoglobin: 13.4 g/dL (ref 13.0–17.7)
IMMATURE GRANULOCYTES: 0 %
Immature Grans (Abs): 0 10*3/uL (ref 0.0–0.1)
Lymphocytes Absolute: 2.2 10*3/uL (ref 0.7–3.1)
Lymphs: 44 %
MCH: 30.8 pg (ref 26.6–33.0)
MCHC: 31.9 g/dL (ref 31.5–35.7)
MCV: 97 fL (ref 79–97)
MONOS ABS: 0.4 10*3/uL (ref 0.1–0.9)
Monocytes: 8 %
NEUTROS PCT: 45 %
Neutrophils Absolute: 2.3 10*3/uL (ref 1.4–7.0)
PLATELETS: 196 10*3/uL (ref 150–379)
RBC: 4.35 x10E6/uL (ref 4.14–5.80)
RDW: 12.5 % (ref 12.3–15.4)
WBC: 5.1 10*3/uL (ref 3.4–10.8)

## 2016-11-10 LAB — COMPREHENSIVE METABOLIC PANEL
A/G RATIO: 1.6 (ref 1.2–2.2)
ALT: 17 IU/L (ref 0–44)
AST: 21 IU/L (ref 0–40)
Albumin: 4.1 g/dL (ref 3.6–4.8)
Alkaline Phosphatase: 54 IU/L (ref 39–117)
BUN / CREAT RATIO: 10 (ref 10–24)
BUN: 13 mg/dL (ref 8–27)
Bilirubin Total: 0.6 mg/dL (ref 0.0–1.2)
CALCIUM: 9.1 mg/dL (ref 8.6–10.2)
CO2: 20 mmol/L (ref 20–29)
Chloride: 109 mmol/L — ABNORMAL HIGH (ref 96–106)
Creatinine, Ser: 1.31 mg/dL — ABNORMAL HIGH (ref 0.76–1.27)
GFR calc Af Amer: 67 mL/min/{1.73_m2} (ref >59)
GFR, EST NON AFRICAN AMERICAN: 58 mL/min/{1.73_m2} — AB (ref >59)
GLUCOSE: 100 mg/dL — AB (ref 65–99)
Globulin, Total: 2.6 g/dL (ref 1.5–4.5)
Potassium: 4.3 mmol/L (ref 3.5–5.2)
SODIUM: 144 mmol/L (ref 134–144)
Total Protein: 6.7 g/dL (ref 6.0–8.5)

## 2016-11-10 LAB — LIPID PANEL
CHOL/HDL RATIO: 3.6 ratio (ref 0.0–5.0)
Cholesterol, Total: 168 mg/dL (ref 100–199)
HDL: 47 mg/dL (ref >39)
LDL Calculated: 110 mg/dL — ABNORMAL HIGH (ref 0–99)
Triglycerides: 53 mg/dL (ref 0–149)
VLDL CHOLESTEROL CAL: 11 mg/dL (ref 5–40)

## 2016-11-10 LAB — HEMOGLOBIN A1C
Est. average glucose Bld gHb Est-mCnc: 94 mg/dL
Hgb A1c MFr Bld: 4.9 % (ref 4.8–5.6)

## 2016-11-10 LAB — TSH: TSH: 1.79 u[IU]/mL (ref 0.450–4.500)

## 2016-11-10 LAB — PSA: Prostate Specific Ag, Serum: 1.6 ng/mL (ref 0.0–4.0)

## 2017-01-25 DIAGNOSIS — Z23 Encounter for immunization: Secondary | ICD-10-CM | POA: Diagnosis not present

## 2017-08-14 ENCOUNTER — Ambulatory Visit: Payer: BLUE CROSS/BLUE SHIELD | Admitting: Internal Medicine

## 2017-08-14 ENCOUNTER — Encounter: Payer: Self-pay | Admitting: Internal Medicine

## 2017-08-14 VITALS — BP 175/101 | HR 61 | Temp 98.3°F | Ht 67.0 in | Wt 180.0 lb

## 2017-08-14 DIAGNOSIS — B182 Chronic viral hepatitis C: Secondary | ICD-10-CM | POA: Diagnosis not present

## 2017-08-14 DIAGNOSIS — K7460 Unspecified cirrhosis of liver: Secondary | ICD-10-CM

## 2017-08-14 LAB — HEPATIC FUNCTION PANEL
AG RATIO: 1.4 (calc) (ref 1.0–2.5)
ALKALINE PHOSPHATASE (APISO): 63 U/L (ref 40–115)
ALT: 12 U/L (ref 9–46)
AST: 13 U/L (ref 10–35)
Albumin: 3.9 g/dL (ref 3.6–5.1)
BILIRUBIN INDIRECT: 0.4 mg/dL (ref 0.2–1.2)
Bilirubin, Direct: 0.1 mg/dL (ref 0.0–0.2)
GLOBULIN: 2.7 g/dL (ref 1.9–3.7)
Total Bilirubin: 0.5 mg/dL (ref 0.2–1.2)
Total Protein: 6.6 g/dL (ref 6.1–8.1)

## 2017-08-14 NOTE — Assessment & Plan Note (Signed)
Cured.  No further monitoring indiicated.

## 2017-08-14 NOTE — Progress Notes (Signed)
   Subjective:    Patient ID: Gregory Holloway, male    DOB: 26-Jun-1955, 62 y.o.   MRN: [id number]  HPI Here for yearly follow up.   Treated for chronic hepatitis C and obtained SVR.  Has F3/4 disease on elastography without cirrhosis on ultrasound. No new issues.    Review of Systems  Constitutional: Negative for fatigue.  Gastrointestinal: Negative for diarrhea.       Objective:   Physical Exam  Constitutional: He appears well-developed and well-nourished. No distress.  HENT:  Mouth/Throat: No oropharyngeal exudate.  Eyes: No scleral icterus.  Cardiovascular: Normal rate, regular rhythm and normal heart sounds.  No murmur heard. Pulmonary/Chest: Effort normal and breath sounds normal. No respiratory distress.  Skin: No rash noted.          Assessment & Plan:

## 2017-08-14 NOTE — Assessment & Plan Note (Addendum)
Will continue HCC screening with ultrasound now and in 6 months. CMP.  rtc 1 year

## 2017-08-26 ENCOUNTER — Encounter: Payer: Self-pay | Admitting: Family Medicine

## 2017-08-26 ENCOUNTER — Other Ambulatory Visit: Payer: BLUE CROSS/BLUE SHIELD

## 2017-11-29 ENCOUNTER — Encounter: Payer: Self-pay | Admitting: Physician Assistant

## 2017-11-29 ENCOUNTER — Ambulatory Visit (INDEPENDENT_AMBULATORY_CARE_PROVIDER_SITE_OTHER): Payer: BLUE CROSS/BLUE SHIELD | Admitting: Physician Assistant

## 2017-11-29 ENCOUNTER — Other Ambulatory Visit: Payer: Self-pay

## 2017-11-29 VITALS — BP 157/92 | HR 88 | Temp 98.3°F | Resp 16 | Ht 66.34 in | Wt 177.0 lb

## 2017-11-29 DIAGNOSIS — R030 Elevated blood-pressure reading, without diagnosis of hypertension: Secondary | ICD-10-CM

## 2017-11-29 DIAGNOSIS — Z131 Encounter for screening for diabetes mellitus: Secondary | ICD-10-CM

## 2017-11-29 DIAGNOSIS — Z0000 Encounter for general adult medical examination without abnormal findings: Secondary | ICD-10-CM | POA: Diagnosis not present

## 2017-11-29 DIAGNOSIS — Z13220 Encounter for screening for lipoid disorders: Secondary | ICD-10-CM | POA: Diagnosis not present

## 2017-11-29 NOTE — Patient Instructions (Addendum)
Start TODAY working on [employer] options! Go have your eye exam.    In terms of elevated blood pressure, I would like you to check your blood pressure at least a couple times over the next week outside of the office and document these values. It is best if you check the blood pressure at different times in the day. Your goal is <140/90. If your values are consistently above this goal, please return to office for further evaluation. If you start to have chest pain, blurred vision, shortness of breath, severe headache, lower leg swelling, or nausea/vomiting please seek care immediately here or at the ED.    If you have lab work done today you will be contacted with your lab results within the next 2 weeks.  If you have not heard from [place] then please contact us. The fastest way to get your results is to register for My Chart.  Health Maintenance, Male A healthy lifestyle and preventive care is important for your health and wellness. Ask your health care provider about what schedule of regular examinations is right for you. What should I know about weight and diet? Eat a Healthy Diet  Eat plenty of vegetables, fruits, whole grains, low-fat dairy products, and lean protein.  Do not eat a lot of foods high in solid fats, added sugars, or salt.  Maintain a Healthy Weight Regular exercise can help you achieve or maintain a healthy weight. You should:  Do at least 150 minutes of exercise each week. The exercise should increase your heart rate and make you sweat (moderate-intensity exercise).  Do strength-training exercises at least twice a week.  Watch Your Levels of Cholesterol and Blood Lipids  Have your blood tested for lipids and cholesterol every 5 years starting at 63 years of age. If you are at high risk for heart disease, you should start having your blood tested when you are 63 years old. You may need to have your cholesterol levels checked more often if: ? Your lipid or cholesterol  levels are high. ? You are older than 63 years of age. ? You are at high risk for heart disease.  What should I know about cancer screening? Many types of cancers can be detected early and may often be prevented. Lung Cancer  You should be screened every year for lung cancer if: ? You are a current smoker who has smoked for at least 30 years. ? You are a former smoker who has quit within the past 15 years.  Talk to your health care provider about your screening options, when you should start screening, and how often you should be screened.  Colorectal Cancer  Routine colorectal cancer screening usually begins at 63 years of age and should be repeated every 5-10 years until you are 64 years old. You may need to be screened more often if early forms of precancerous polyps or small growths are found. Your health care provider may recommend screening at an earlier age if you have risk factors for colon cancer.  Your health care provider may recommend using home test kits to check for hidden blood in the stool.  A small camera at the end of a tube can be used to examine your colon (sigmoidoscopy or colonoscopy). This checks for the earliest forms of colorectal cancer.  Prostate and Testicular Cancer  Depending on your age and overall health, your health care provider may do certain tests to screen for prostate and testicular cancer.  Talk to your  health care provider about any symptoms or concerns you have about testicular or prostate cancer.  Skin Cancer  Check your skin from head to toe regularly.  Tell your health care provider about any new moles or changes in moles, especially if: ? There is a change in a mole's size, shape, or color. ? You have a mole that is larger than a pencil eraser.  Always use sunscreen. Apply sunscreen liberally and repeat throughout the day.  Protect yourself by wearing long sleeves, pants, a wide-brimmed hat, and sunglasses when outside.  What should  I know about heart disease, diabetes, and high blood pressure?  If you are 59-59 years of age, have your blood pressure checked every 3-5 years. If you are 59 years of age or older, have your blood pressure checked every year. You should have your blood pressure measured twice-once when you are at a hospital or clinic, and once when you are not at a hospital or clinic. Record the average of the two measurements. To check your blood pressure when you are not at a hospital or clinic, you can use: ? An automated blood pressure machine at a pharmacy. ? A home blood pressure monitor.  Talk to your health care provider about your target blood pressure.  If you are between 33-5 years old, ask your health care provider if you should take aspirin to prevent heart disease.  Have regular diabetes screenings by checking your fasting blood sugar level. ? If you are at a normal weight and have a low risk for diabetes, have this test once every three years after the age of 62. ? If you are overweight and have a high risk for diabetes, consider being tested at a younger age or more often.  A one-time screening for abdominal aortic aneurysm (AAA) by ultrasound is recommended for men aged 65-75 years who are current or former smokers. What should I know about preventing infection? Hepatitis B If you have a higher risk for hepatitis B, you should be screened for this virus. Talk with your health care provider to find out if you are at risk for hepatitis B infection. Hepatitis C Blood testing is recommended for:  Everyone born from [birth year] through 1966.  Anyone with known risk factors for hepatitis C.  Sexually Transmitted Diseases (STDs)  You should be screened each year for STDs including gonorrhea and chlamydia if: ? You are sexually active and are younger than 63 years of age. ? You are older than 63 years of age and your health care provider tells you that you are at risk for this type of  infection. ? Your sexual activity has changed since you were last screened and you are at an increased risk for chlamydia or gonorrhea. Ask your health care provider if you are at risk.  Talk with your health care provider about whether you are at high risk of being infected with HIV. Your health care provider may recommend a prescription medicine to help prevent HIV infection.  What else can I do?  Schedule regular health, dental, and eye exams.  Stay current with your vaccines (immunizations).  Do not use any tobacco products, such as cigarettes, chewing tobacco, and e-cigarettes. If you need help quitting, ask your health care provider.  Limit alcohol intake to no more than 2 drinks per day. One drink equals 12 ounces of beer, 5 ounces of wine, or 1 ounces of hard liquor.  Do not use street drugs.  Do not share needles.  Ask your health care provider for help if you need support or information about quitting drugs.  Tell your health care provider if you often feel depressed.  Tell your health care provider if you have ever been abused or do not feel safe at home. This information is not intended to replace advice given to you by your health care provider. Make sure you discuss any questions you have with your health care provider. Document Released: 09/14/2007 Document Revised: 11/15/2015 Document Reviewed: 12/20/2014 Elsevier Interactive Patient Education  2018 [employer]   IF you received an x-ray today, you will receive [facility] invoice from [facility]. Please contact [facility] at [phone] with questions or concerns regarding your invoice.   IF you received labwork today, you will receive an invoice from [facility]. Please contact [facility] at [phone] with questions or concerns regarding your invoice.   Our billing staff will not be able to assist you with questions regarding bills from these companies.  You will be contacted with the lab results as  soon as they are available. The fastest way to get your results is to activate your My Chart account. Instructions are located on the last page of this paperwork. If you have not heard from us regarding the results in 2 weeks, please contact this office.

## 2017-11-29 NOTE — Progress Notes (Signed)
Gregory Holloway  MRN: [id number] DOB: 1955/04/02  Subjective:  Pt is a 62 y.o. male who presents for annual physical exam. Pt is not fasting today.   Diet: "a lot of junk." ex: bag of popcorn, crackers,cookies, mt dew.  Exercise: no structured exercise Sleep: 5 hrs   BM: daily  Last dental exam: "a long time ago, I have ventures." Last vision exam: "was going to go today."  Last colonoscopy:079/2018  Vaccinations      Tetanus: 11/27/2013      Zostavax: 09/16/2015   Patient Active Problem List   Diagnosis Date Noted  . Vaccine counseling 08/13/2016  . Hepatic cirrhosis ([place]) 03/12/2016  . Seasonal allergies 03/12/2016  . Chronic hepatitis C without hepatic coma ([place]) 11/27/2013    Current Outpatient Medications on File Prior to Visit  Medication Sig Dispense Refill  . CALCIUM MAGNESIUM 750 PO Take by mouth daily.    . mometasone (NASONEX) 50 MCG/ACT nasal spray Place 2 sprays into the nose daily. (Patient not taking: Reported on 11/29/2017) 17 g 12   No current facility-administered medications on file prior to visit.     No Known Allergies  Social History   Socioeconomic History  . Marital status: Married    Spouse name: Not on file  . Number of children: 6  . Years of education: 75  . Highest education level: Not on file  Occupational History  . Occupation: truck Geophysicist/field seismologist  . Occupation: Theme park manager   Social Needs  . Financial resource strain: Not hard at all  . Food insecurity:    Worry: Never true    Inability: Never true  . Transportation needs:    Medical: No    Non-medical: No  Tobacco Use  . Smoking status: Former Smoker    Types: Cigarettes  . Smokeless tobacco: Never Used  . Tobacco comment: quit 20+ yrs ago   Substance and Sexual Activity  . Alcohol use: No  . Drug use: No  . Sexual activity: Not Currently    Partners: Female    Comment: with monogamous partner  Lifestyle  . Physical activity:    Days per week: 0 days    Minutes per session: 0  min  . Stress: Not at all  Relationships  . Social connections:    Talks on phone: Once a week    Gets together: Never    Attends religious service: More than 4 times per year    Active member of club or organization: Yes    Attends meetings of clubs or organizations: More than 4 times per year    Relationship status: Married  Other Topics Concern  . 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       Tobacco: none; quit 27 years ago.      Alcohol: none      Drugs: none       Exercise: none      Seatbelt: 100%; no texting             Past Surgical History:  Procedure Laterality Date  . broken nose    . COLONOSCOPY  2008  . finegr ligament repair      Family History  Problem Relation Age of Onset  . Hypertension Mother   .  Hypertension Father   . Hypertension Sister   . Hypertension Brother   . Colon cancer Neg Hx   . Colon polyps Neg Hx   . Esophageal cancer Neg Hx   . Rectal cancer Neg Hx   . Stomach cancer Neg Hx     Review of Systems  Constitutional: Negative for activity change, appetite change, chills, diaphoresis, fatigue, fever and unexpected weight change.  HENT: Negative for congestion, dental problem, drooling, ear discharge, ear pain, facial swelling, hearing loss, mouth sores, nosebleeds, postnasal drip, rhinorrhea, sinus pressure, sinus pain, sneezing, sore throat, tinnitus, trouble swallowing and voice change.   Eyes: Negative for photophobia, pain, discharge, redness, itching and visual disturbance.  Respiratory: Negative for apnea, cough, choking, chest tightness, shortness of breath, wheezing and stridor.   Cardiovascular: Negative for chest pain, palpitations and leg swelling.  Gastrointestinal: Negative for abdominal distention, abdominal pain, anal bleeding, blood in stool,  constipation, diarrhea, nausea, rectal pain and vomiting.  Endocrine: Negative for cold intolerance, heat intolerance, polydipsia, polyphagia and polyuria.  Genitourinary: Negative for decreased urine volume, difficulty urinating, discharge, dysuria, enuresis, flank pain, frequency, genital sores, hematuria, penile pain, penile swelling, scrotal swelling, testicular pain and urgency.  Musculoskeletal: Negative for arthralgias, back pain, gait problem, joint swelling, myalgias, neck pain and neck stiffness.  Skin: Negative for color change, pallor, rash and wound.  Allergic/Immunologic: Negative for environmental allergies, food allergies and immunocompromised state.  Neurological: Negative for dizziness, tremors, seizures, syncope, facial asymmetry, speech difficulty, weakness, light-headedness, numbness and headaches.  Hematological: Negative for adenopathy. Does not bruise/bleed easily.  Psychiatric/Behavioral: Negative for agitation, behavioral problems, confusion, decreased concentration, dysphoric mood, hallucinations, self-injury, sleep disturbance and suicidal ideas. The patient is not nervous/anxious and is not hyperactive.     Objective:  BP (!) 157/92   Pulse 88   Temp 98.3 F (36.8 C) (Oral)   Resp 16   Ht 5' 6.34" (1.685 m)   Wt 177 lb (80.3 kg)   SpO2 97%   BMI 28.28 kg/m   Physical Exam  Constitutional: He is oriented to person, place, and time. He appears well-developed and well-nourished. No distress.  HENT:  Head: Normocephalic and atraumatic.  Right Ear: Hearing, tympanic membrane, external ear and ear canal normal.  Left Ear: Hearing, tympanic membrane, external ear and ear canal normal.  Nose: Nose normal.  Mouth/Throat: Uvula is midline, oropharynx is clear and moist and mucous membranes are normal. No oropharyngeal exudate.  Eyes: Pupils are equal, round, and reactive to light. Conjunctivae and EOM are normal.  Neck: Trachea normal and normal range of motion. No  thyroid mass and no thyromegaly present.  Cardiovascular: Normal rate, regular rhythm, normal heart sounds and intact distal pulses.  Pulmonary/Chest: Effort normal and breath sounds normal.  Abdominal: Soft. Normal appearance and bowel sounds are normal. There is no tenderness.  Musculoskeletal: Normal range of motion.  Lymphadenopathy:       Head (right side): No submental, no submandibular, no tonsillar, no preauricular, no posterior auricular and no occipital adenopathy present.       Head (left side): No submental, no submandibular, no tonsillar, no preauricular, no posterior auricular and no occipital adenopathy present.    He has no cervical adenopathy.       Right: No supraclavicular adenopathy present.       Left: No supraclavicular adenopathy present.  Neurological: He is alert and oriented to person, place, and time. He has normal strength and normal reflexes.  Skin: Skin is warm and dry.  Psychiatric: He has a normal mood and affect.  Vitals reviewed.   Visual Acuity Screening   Right eye Left eye Both eyes  Without correction:     With correction: 20/25 20/25 20/25   BP Readings from Last 3 Encounters:  11/29/17 (!) 157/92  08/14/17 (!) 175/101  11/09/16 (!) 160/88   Wt Readings from Last 3 Encounters:  11/29/17 177 lb (80.3 kg)  08/14/17 180 lb (81.6 kg)  11/09/16 175 lb 6.4 oz (79.6 kg)     Assessment and Plan :  Discussed healthy lifestyle, diet, exercise, preventative care, vaccinations, and addressed patient's concerns. Plan for follow up in one year. Otherwise, plan for specific conditions below.  1. Annual physical exam  2. Blood pressure elevated without history of HTN Asymptomatic.  Has recently had a few in office readings that were elevated.  Has not checked it outside the office.  Instructed to check bp outside of office over the next couple of weeks. Return if consistently >140/90 as he may need to start medication.  Encouraged to work on diet and  exercise, as he is lots of room for improvement. He is motivated to do so.  Given strict ED precautions.  - CBC with Differential/Platelet - CMP14+EGFR - Lipid panel - TSH - Urinalysis, dipstick only  3. Screening, lipid - CMP14+EGFR  4. Screening for diabetes mellitus - Hemoglobin A1c    Tenna Delaine, PA-C  [facility] 11/29/2017 4:14 PM

## 2017-11-30 LAB — URINALYSIS, DIPSTICK ONLY
Bilirubin, UA: NEGATIVE
GLUCOSE, UA: NEGATIVE
Ketones, UA: NEGATIVE
Leukocytes, UA: NEGATIVE
NITRITE UA: NEGATIVE
PH UA: 5.5 (ref 5.0–7.5)
Protein, UA: NEGATIVE
RBC, UA: NEGATIVE
Specific Gravity, UA: 1.02 (ref 1.005–1.030)
UUROB: 0.2 mg/dL (ref 0.2–1.0)

## 2017-11-30 LAB — CBC WITH DIFFERENTIAL/PLATELET
BASOS ABS: 0.1 10*3/uL (ref 0.0–0.2)
BASOS: 1 %
EOS (ABSOLUTE): 0.3 10*3/uL (ref 0.0–0.4)
Eos: 4 %
Hematocrit: 42 % (ref 37.5–51.0)
Hemoglobin: 13.1 g/dL (ref 13.0–17.7)
IMMATURE GRANS (ABS): 0 10*3/uL (ref 0.0–0.1)
IMMATURE GRANULOCYTES: 0 %
LYMPHS: 41 %
Lymphocytes Absolute: 2.6 10*3/uL (ref 0.7–3.1)
MCH: 30 pg (ref 26.6–33.0)
MCHC: 31.2 g/dL — ABNORMAL LOW (ref 31.5–35.7)
MCV: 96 fL (ref 79–97)
Monocytes Absolute: 0.5 10*3/uL (ref 0.1–0.9)
Monocytes: 8 %
NEUTROS PCT: 46 %
Neutrophils Absolute: 2.9 10*3/uL (ref 1.4–7.0)
PLATELETS: 212 10*3/uL (ref 150–450)
RBC: 4.37 x10E6/uL (ref 4.14–5.80)
RDW: 12.8 % (ref 12.3–15.4)
WBC: 6.3 10*3/uL (ref 3.4–10.8)

## 2017-11-30 LAB — CMP14+EGFR
ALT: 15 IU/L (ref 0–44)
AST: 14 IU/L (ref 0–40)
Albumin/Globulin Ratio: 1.7 (ref 1.2–2.2)
Albumin: 4.5 g/dL (ref 3.6–4.8)
Alkaline Phosphatase: 68 IU/L (ref 39–117)
BUN/Creatinine Ratio: 7 — ABNORMAL LOW (ref 10–24)
BUN: 10 mg/dL (ref 8–27)
Bilirubin Total: 0.5 mg/dL (ref 0.0–1.2)
CALCIUM: 9.3 mg/dL (ref 8.6–10.2)
CHLORIDE: 106 mmol/L (ref 96–106)
CO2: 25 mmol/L (ref 20–29)
Creatinine, Ser: 1.34 mg/dL — ABNORMAL HIGH (ref 0.76–1.27)
GFR, EST AFRICAN AMERICAN: 65 mL/min/{1.73_m2} (ref >59)
GFR, EST NON AFRICAN AMERICAN: 56 mL/min/{1.73_m2} — AB (ref >59)
GLUCOSE: 98 mg/dL (ref 65–99)
Globulin, Total: 2.6 g/dL (ref 1.5–4.5)
Potassium: 4.4 mmol/L (ref 3.5–5.2)
Sodium: 143 mmol/L (ref 134–144)
TOTAL PROTEIN: 7.1 g/dL (ref 6.0–8.5)

## 2017-11-30 LAB — LIPID PANEL
Chol/HDL Ratio: 3.6 ratio (ref 0.0–5.0)
Cholesterol, Total: 167 mg/dL (ref 100–199)
HDL: 47 mg/dL (ref >39)
LDL CALC: 98 mg/dL (ref 0–99)
Triglycerides: 109 mg/dL (ref 0–149)
VLDL Cholesterol Cal: 22 mg/dL (ref 5–40)

## 2017-11-30 LAB — HEMOGLOBIN A1C
Est. average glucose Bld gHb Est-mCnc: 100 mg/dL
HEMOGLOBIN A1C: 5.1 % (ref 4.8–5.6)

## 2017-11-30 LAB — TSH: TSH: 1.65 u[IU]/mL (ref 0.450–4.500)

## 2017-12-04 ENCOUNTER — Other Ambulatory Visit: Payer: Self-pay | Admitting: Physician Assistant

## 2017-12-04 MED ORDER — ATORVASTATIN CALCIUM 20 MG PO TABS: 20.0000 mg | ORAL_TABLET | Freq: Every day | ORAL | 3 refills | Status: DC

## 2017-12-04 NOTE — Progress Notes (Signed)
Meds ordered this encounter  Medications  . atorvastatin (LIPITOR) 20 MG tablet    Sig: Take 1 tablet (20 mg total) by mouth daily.    Dispense:  90 tablet    Refill:  3    Order Specific Question:   Supervising Provider    Answer:   SAGARDIA, MIGUEL JOSE [[id number]]    

## 2017-12-09 ENCOUNTER — Telehealth: Payer: Self-pay | Admitting: Physician Assistant

## 2017-12-09 NOTE — Telephone Encounter (Signed)
Copied from CRM [id number]. Topic: General - Other >> Dec 09, 2017 10:16 AM Tamela Oddi wrote: Reason for CRM: Patient called to leave a message for the doctor regarding a message she sent to him through My Chart.  Please call patient at [phone].

## 2017-12-16 NOTE — Telephone Encounter (Signed)
I called pt and left vm for pt to call our office back with a detail message of his concerns.

## 2017-12-23 DIAGNOSIS — H524 Presbyopia: Secondary | ICD-10-CM | POA: Diagnosis not present

## 2018-01-12 IMAGING — US US ABDOMEN COMPLETE W/ ELASTOGRAPHY
2 series · 12 of 25 positions shown · non-contrast
Comparison: None.

CLINICAL DATA: Chronic hepatitis-C.



[Series 1: us abdomen complete w/ elastography · 0.20mm/px · 6 of 96 slices shown (1 of 2)]
[im 9/96]
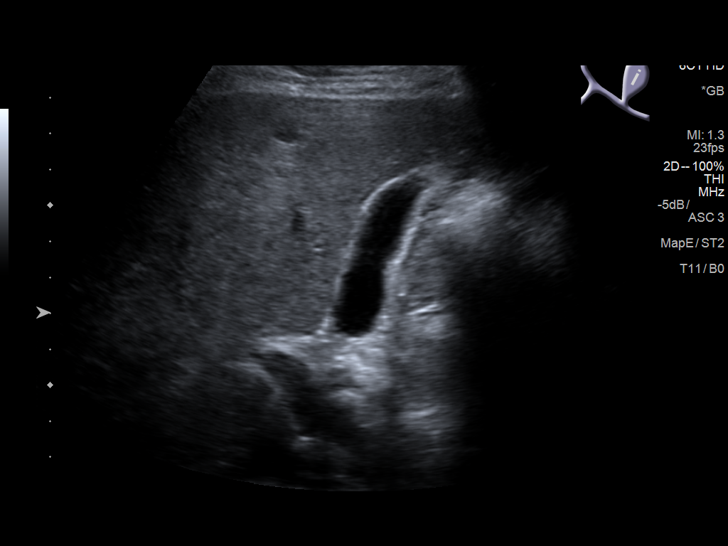
[im 26/96]
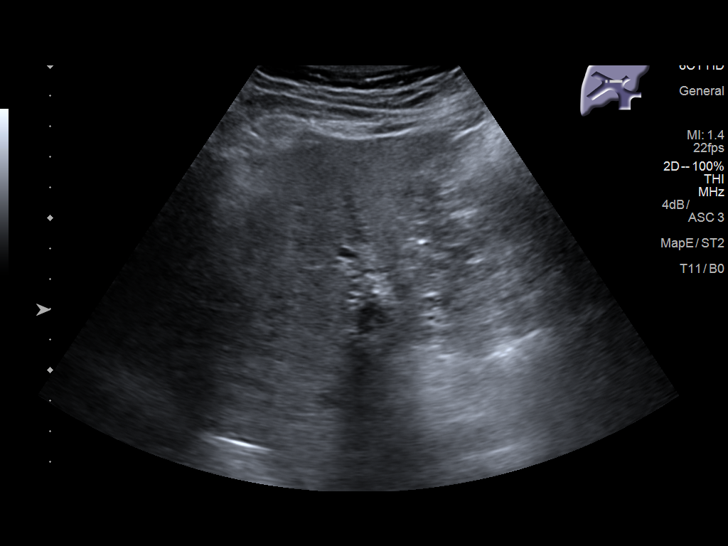
[im 44/96]
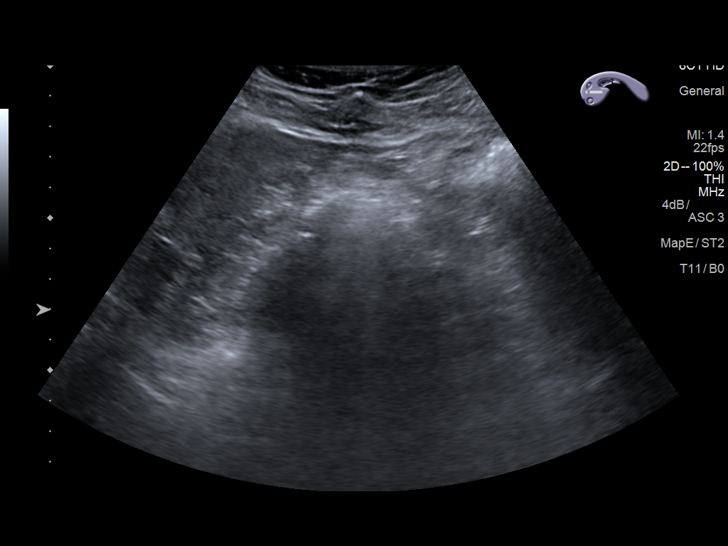
[im 61/96]
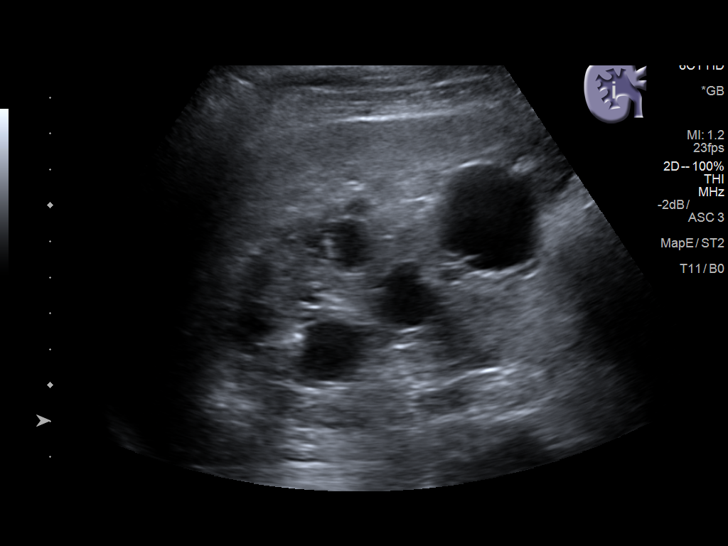
[im 78/96]
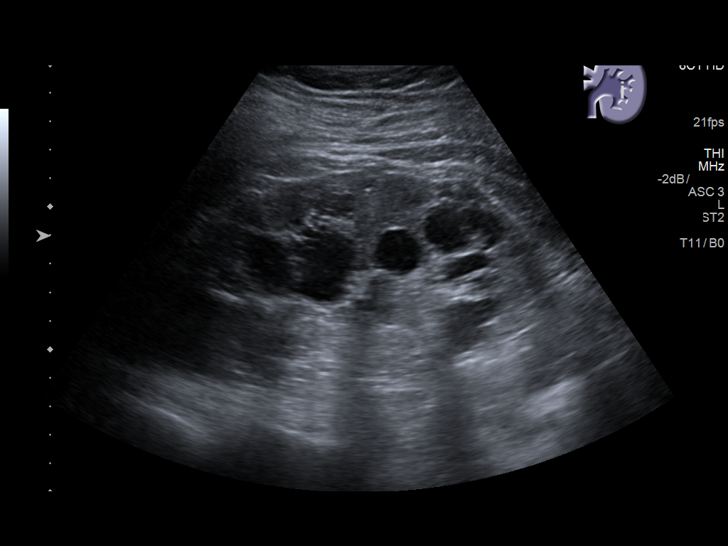
[im 96/96]
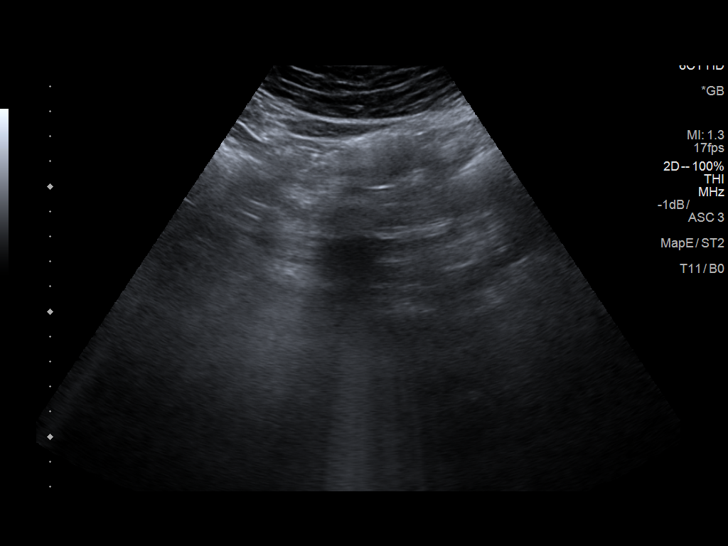

[Series 2001: us abdomen complete w/ elastography · 0.17mm/px · 6 of 99 slices shown (2 of 2)]
[im 9/99]
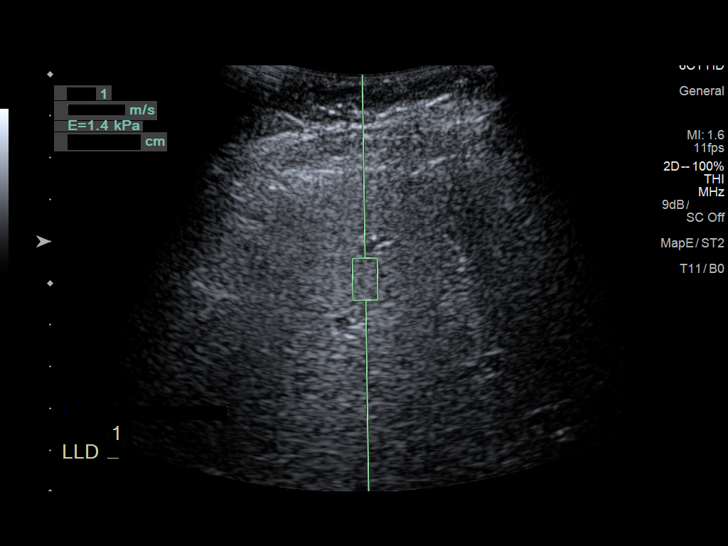
[im 25/99]
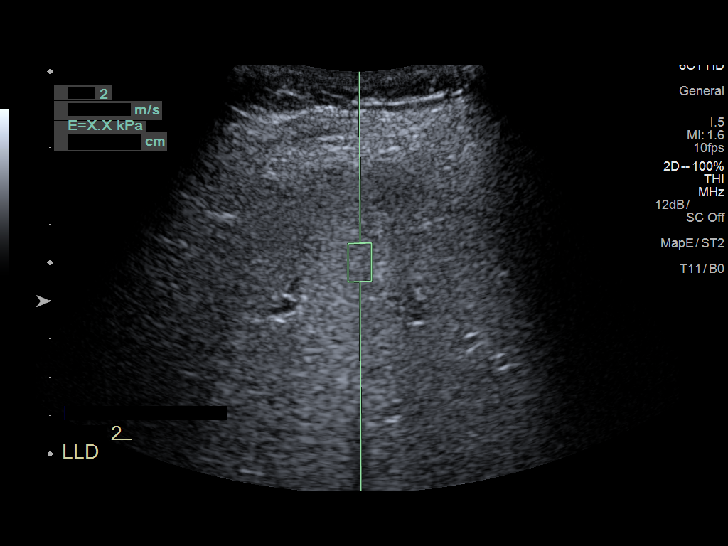
[im 41/99]
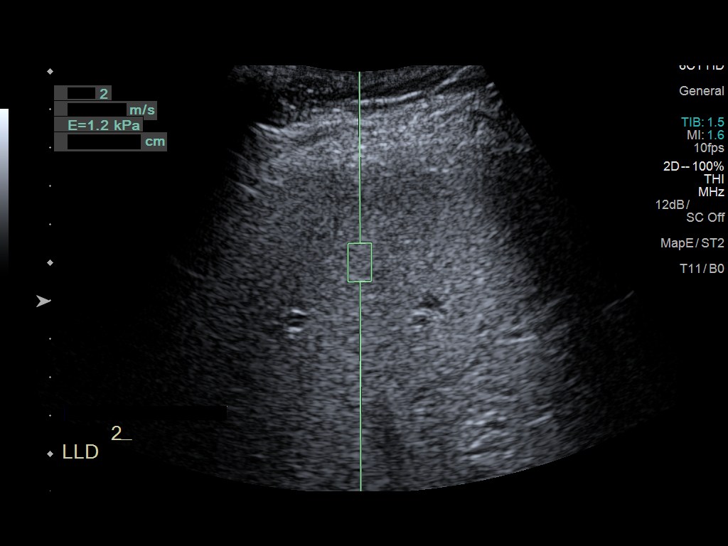
[im 58/99]
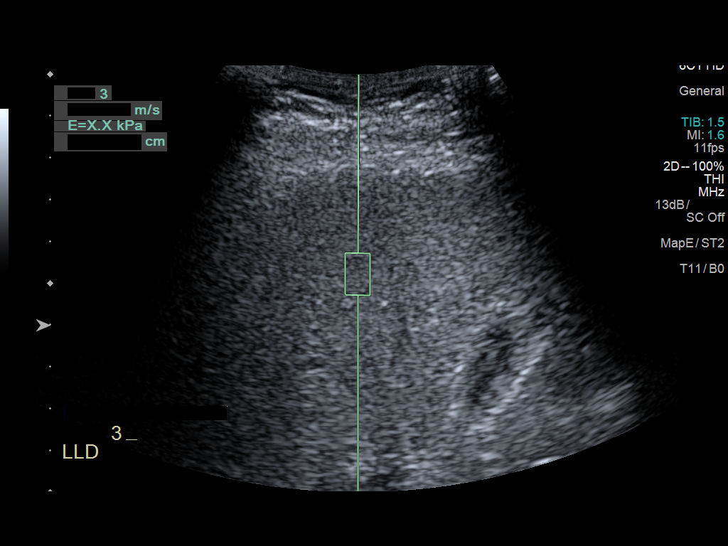
[im 74/99]
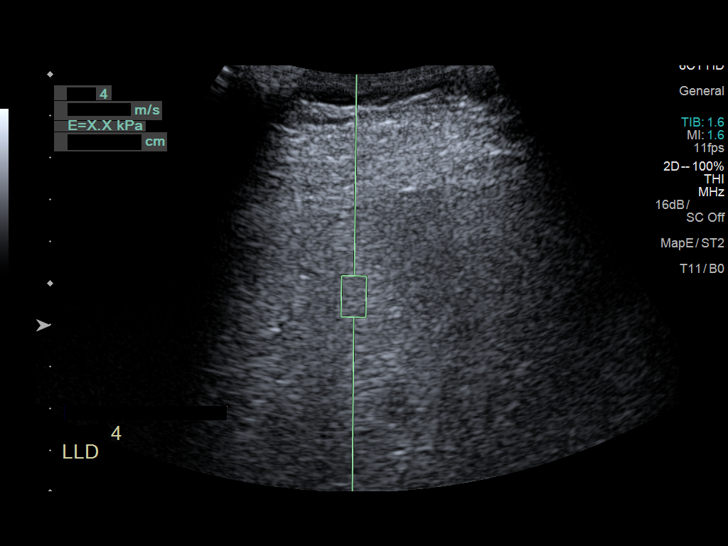
[im 90/99]
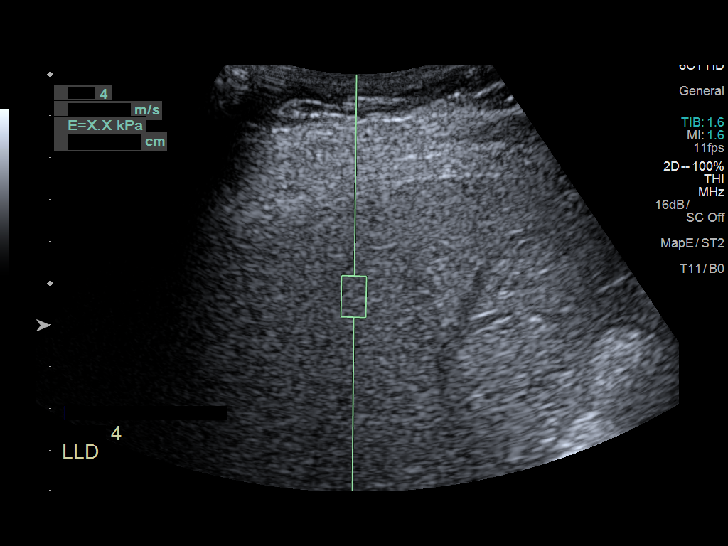

[12 of 25 positions shown; findings below may reference images not displayed]

FINDINGS: ULTRASOUND ABDOMEN

Gallbladder: No gallstones or wall thickening visualized. No
sonographic Murphy sign noted by sonographer.

Common bile duct: Diameter: 5 mm, within normal limits

Liver: Diffusely increased echogenicity of the hepatic parenchyma,
consistent with diffuse hepatic steatosis/hepatocellular disease.
Several tiny scattered sub-cm hepatic cysts are noted, but no solid
liver masses are visualized. Portal vein is patent.

IVC: No abnormality visualized.

Pancreas: Not visualized due to overlying bowel gas.

Spleen: Size and appearance within normal limits.

Right Kidney: Length: 11.0 cm. Echogenicity within normal limits. No
evidence of hydronephrosis. Multiple renal cysts are seen, with at
least 1 complex cyst in the upper pole measuring 5.2 cm which shows
several internal septations peripherally. No solid renal mass
identified.

Left Kidney: Length: 11.5 cm. Echogenicity within normal limits.
Multiple renal cysts are noted, largest measuring 5 cm. No definite
complex cystic or solid masses identified. No hydronephrosis
visualized.

Abdominal aorta: Not well visualized due to overlying bowel gas.

Other findings: None.

ULTRASOUND HEPATIC ELASTOGRAPHY

Device: Siemens Helix VTQ

Patient position: Left Lateral Decubitus

Transducer [id number]

Number of measurements: 10

Hepatic segment:  8

Median velocity:   3.85  m/sec

IQR:

IQR/Median velocity ratio:

Corresponding Metavir fibrosis score:  Some F3 + F4

Risk of fibrosis: High

Limitations of exam: Respiratory motion

Pertinent findings noted on other imaging exams:  None

Please note that abnormal shear wave velocities may also be
identified in clinical settings other than with hepatic fibrosis,
such as: acute hepatitis, elevated right heart and central venous
pressures including use of beta blockers, Elyana disease
([place]), infiltrative processes such as
mastocytosis/amyloidosis/infiltrative tumor, extrahepatic
cholestasis, in the post-prandial state, and liver transplantation.
Correlation with patient history, laboratory data, and clinical
condition recommended.
IMPRESSION: ULTRASOUND ABDOMEN:
No evidence of gallstones or biliary dilatation.

Diffusely increased echogenicity of the hepatic parenchyma,
consistent with diffuse hepatic steatosis/hepatocellular disease. No
definite liver mass visualized.

Bilateral renal cysts, with at least 1 complex cystic lesion in
upper pole of right kidney measuring 5.2 cm. Renal protocol abdomen
CT without and with contrast recommended for further evaluation.

ULTRASOUND HEPATIC ELASTOGRAPHY:

Median hepatic shear wave velocity is calculated at 3.85 m/sec.

Corresponding Metavir fibrosis score is  Some F3 + F4.

Risk of fibrosis is High.

Follow-up: Follow up advised

## 2018-09-02 IMAGING — US US ABDOMEN LIMITED
1 series · 14 of 25 positions shown · non-contrast
Comparison: Ultrasound January 10, 2016.

CLINICAL DATA: Hepatic cirrhosis.

EXAM:
US ABDOMEN LIMITED - RIGHT UPPER QUADRANT

[Series 1: us abdomen limited · 0.26mm/px · 14 of 59 slices shown]
[im 1/59]
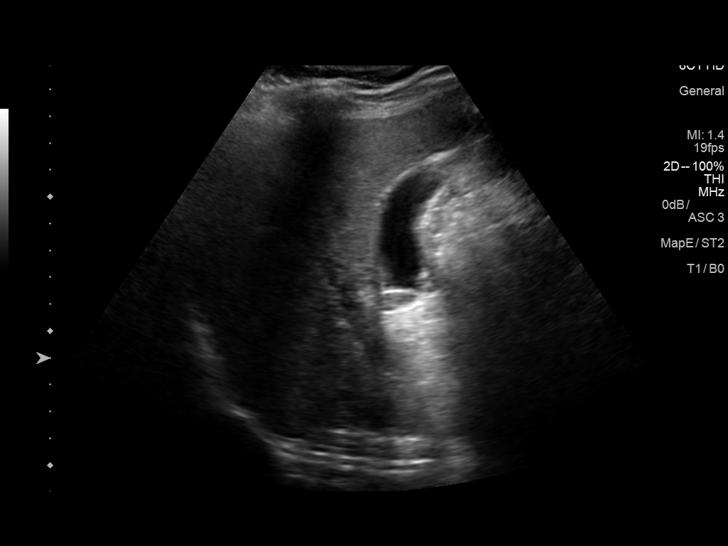
[im 5/59]
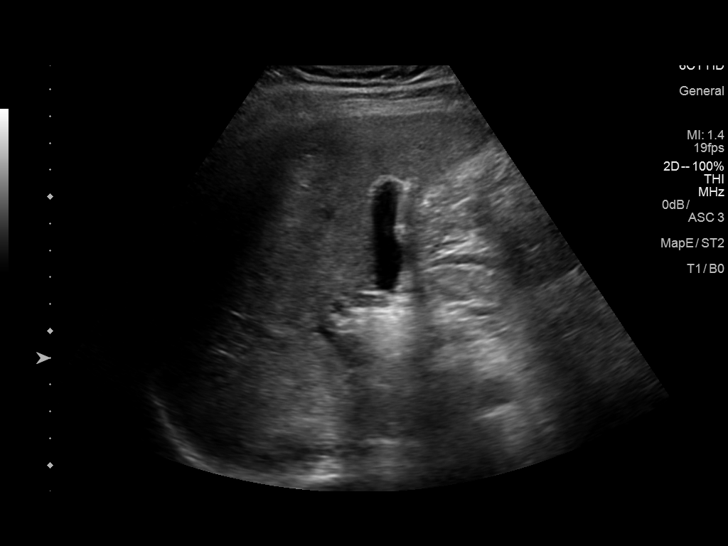
[im 10/59]
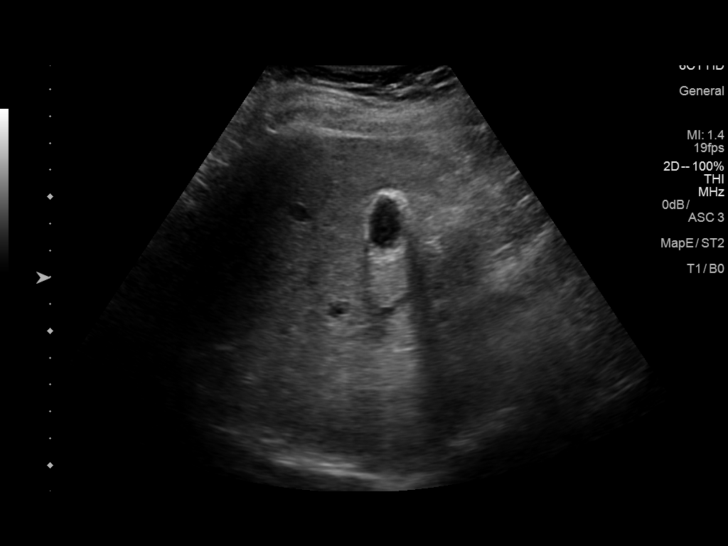
[im 15/59]
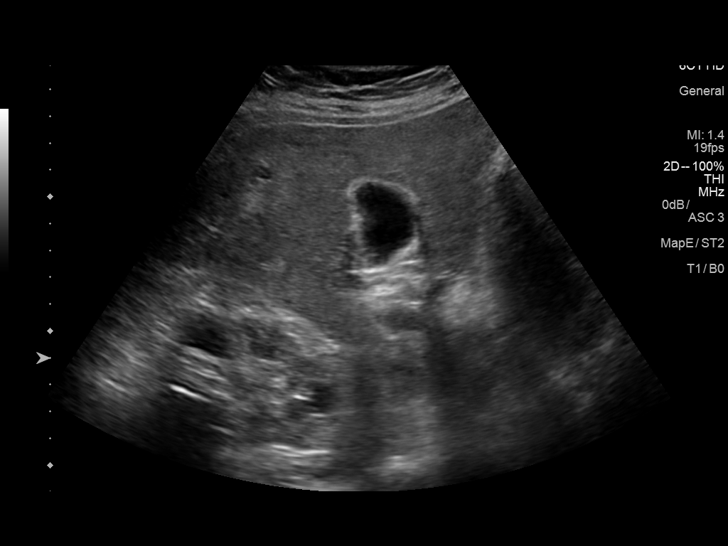
[im 20/59]
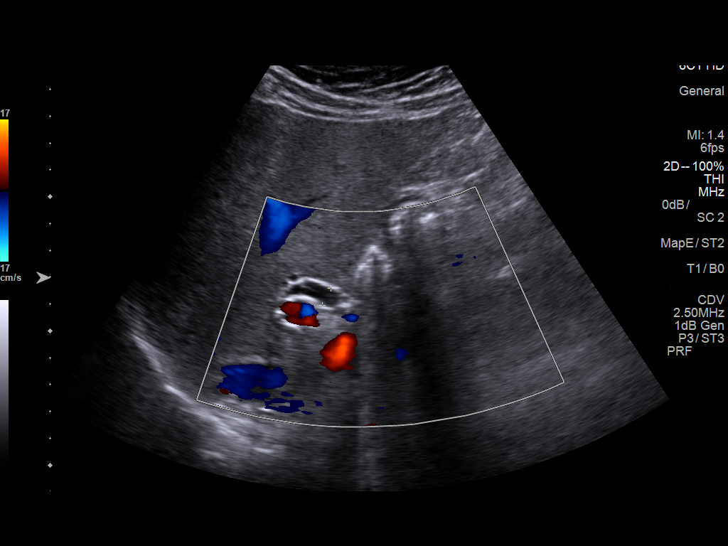
[im 22/59]
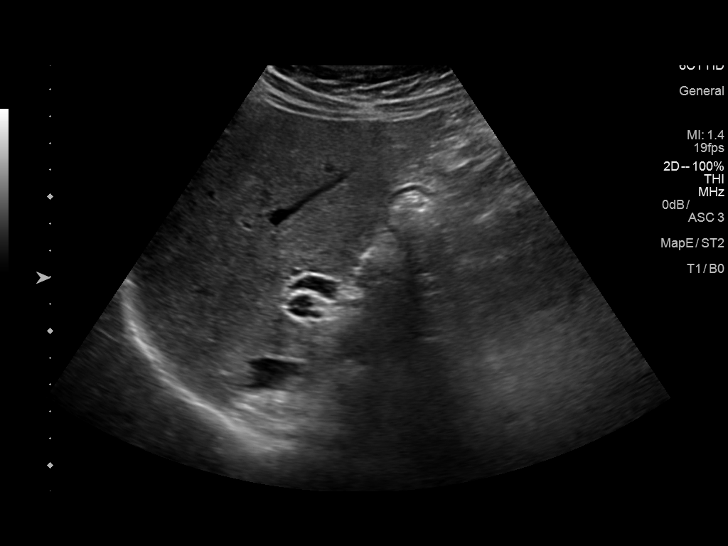
[im 27/59]
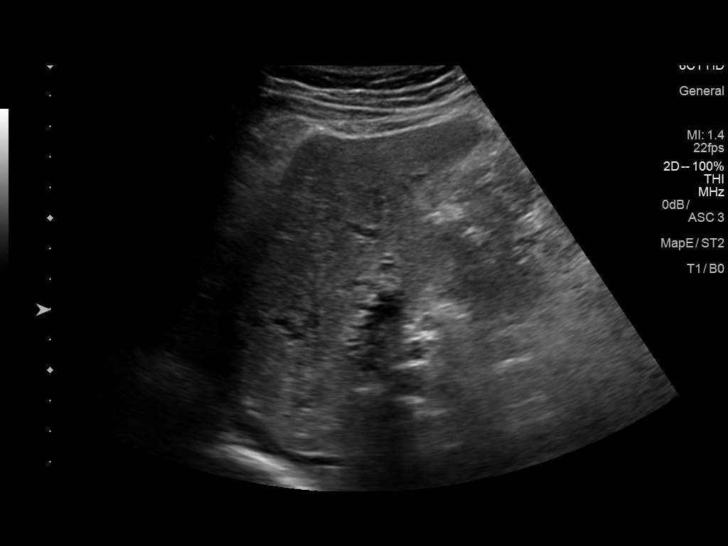
[im 32/59]
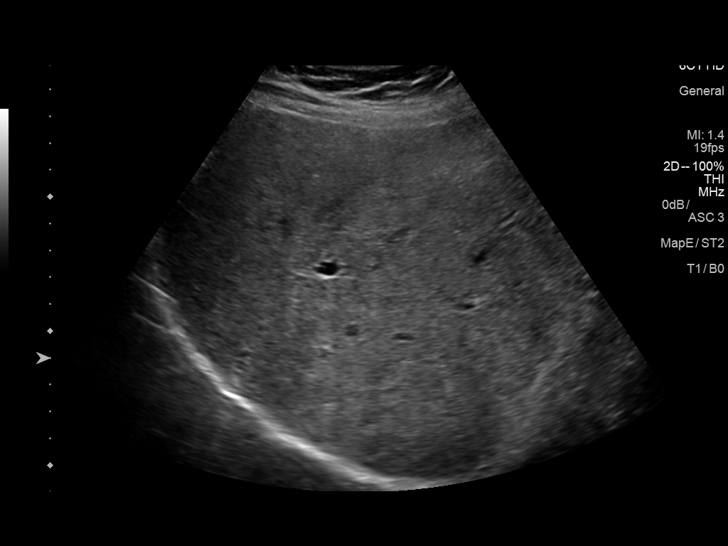
[im 37/59]
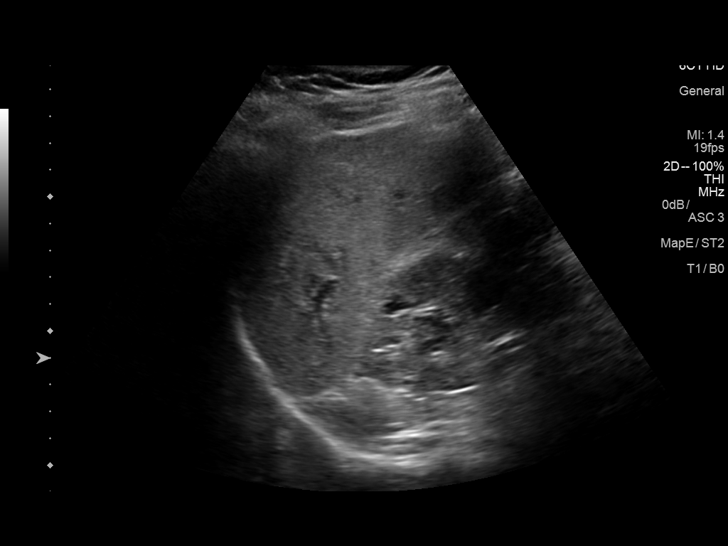
[im 39/59]
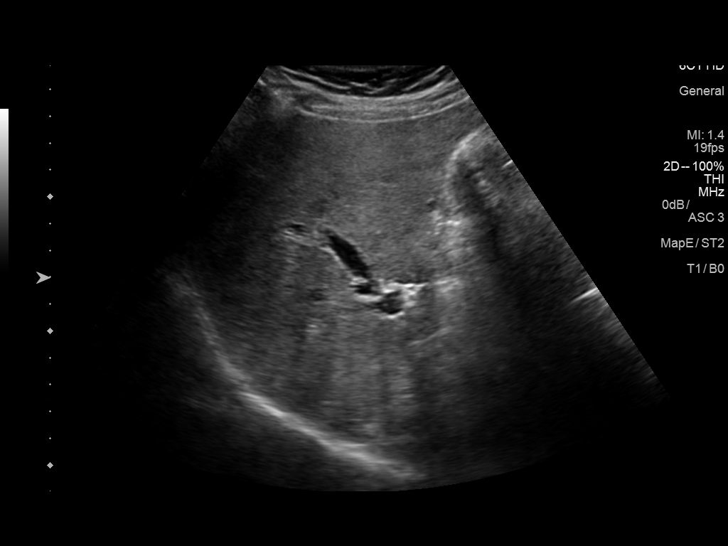
[im 44/59]
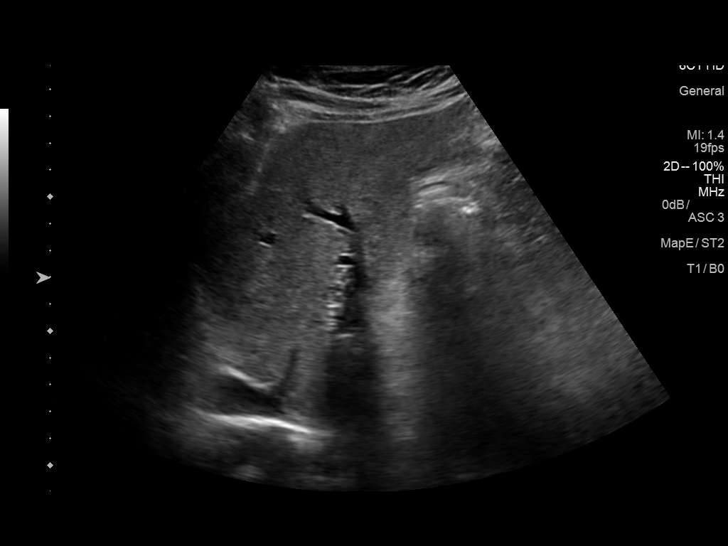
[im 49/59]
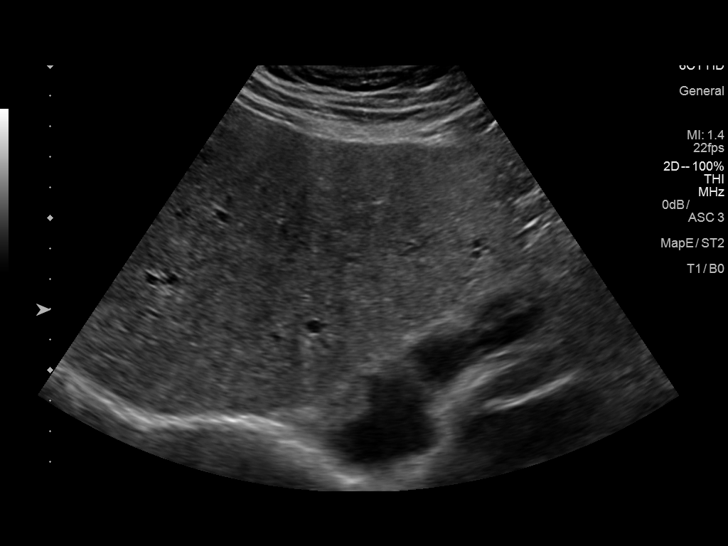
[im 54/59]
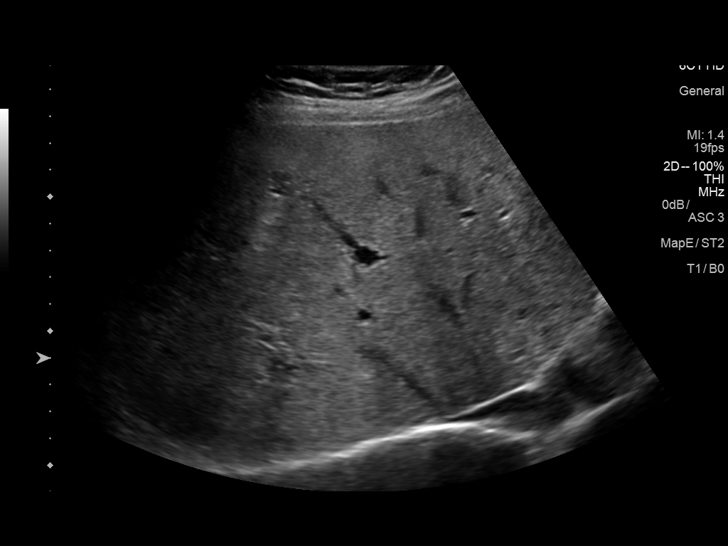
[im 59/59]
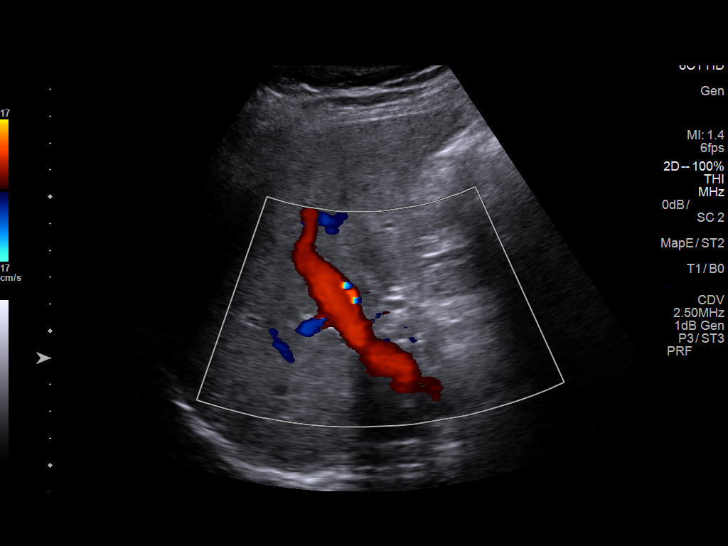

[14 of 25 positions shown; findings below may reference images not displayed]

FINDINGS: Gallbladder:

Focal wall thickening is noted measured at 5 mm, but no diffuse wall
thickening is noted. No pericholecystic fluid is noted. No definite
gallstones are noted. No sonographic Murphy's sign is noted.

Common bile duct:

Diameter: 7 mm. Probable 6 mm stone seen in proximal common bile
duct consistent with choledocholithiasis.

Liver:

Multiple small cysts are noted. Heterogeneous echotexture of hepatic
parenchyma is noted suggesting diffuse hepatocellular disease. Mild
intrahepatic biliary dilatation is noted.
IMPRESSION: Heterogeneous echotexture of hepatic parenchyma is noted suggesting
diffuse hepatocellular disease.

Mild intrahepatic biliary dilatation is noted as well as mild
proximal common bile duct dilatation. This appears to be due to a 6
mm stone seen in the proximal common bile duct consistent with
choledocholithiasis. These results will be called to the ordering
clinician or representative by the Radiologist Assistant, and
communication documented in the PACS or zVision Dashboard.

## 2018-12-23 ENCOUNTER — Other Ambulatory Visit: Payer: Self-pay

## 2018-12-23 ENCOUNTER — Encounter: Payer: Self-pay | Admitting: Registered Nurse

## 2018-12-23 ENCOUNTER — Ambulatory Visit (INDEPENDENT_AMBULATORY_CARE_PROVIDER_SITE_OTHER): Payer: BC Managed Care – PPO | Admitting: Registered Nurse

## 2018-12-23 VITALS — BP 158/80 | HR 78 | Temp 99.0°F | Resp 16 | Ht 66.93 in | Wt 183.0 lb

## 2018-12-23 DIAGNOSIS — Z23 Encounter for immunization: Secondary | ICD-10-CM

## 2018-12-23 DIAGNOSIS — Z13228 Encounter for screening for other metabolic disorders: Secondary | ICD-10-CM

## 2018-12-23 DIAGNOSIS — Z1329 Encounter for screening for other suspected endocrine disorder: Secondary | ICD-10-CM | POA: Diagnosis not present

## 2018-12-23 DIAGNOSIS — Z0001 Encounter for general adult medical examination with abnormal findings: Secondary | ICD-10-CM

## 2018-12-23 DIAGNOSIS — Z13220 Encounter for screening for lipoid disorders: Secondary | ICD-10-CM

## 2018-12-23 DIAGNOSIS — Z130 Encounter for screening for diseases of the blood and blood-forming organs and certain disorders involving the immune mechanism: Secondary | ICD-10-CM | POA: Diagnosis not present

## 2018-12-23 DIAGNOSIS — R0609 Other forms of dyspnea: Secondary | ICD-10-CM

## 2018-12-23 DIAGNOSIS — R0600 Dyspnea, unspecified: Secondary | ICD-10-CM

## 2018-12-23 DIAGNOSIS — R9431 Abnormal electrocardiogram [ECG] [EKG]: Secondary | ICD-10-CM

## 2018-12-23 NOTE — Progress Notes (Signed)
Established Patient Office Visit  Subjective:  Patient ID: Gregory Whitner., male    DOB: September 03, 1955  Age: 63 y.o. MRN: [id number]  CC:  Chief Complaint  Patient presents with  . Annual Exam    HPI Gregory Holloway. presents for visit to establish care and CPE. Formerly a patient of [place].  He reports no major changes - feels well overall, but has experienced an increase in frequency of dyspnea on exertion, dizziness, and some swelling around his ankles. He states that these started around 1 year ago and happen intermittently. Asymptomatic today. He has Hepatitis C, no other major medical history. Formerly on a statin, has since stopped taking this.   Otherwise, feels well. Requests labs and flu shots today.   Past Medical History:  Diagnosis Date  . Allergy    Nasonex  . Hepatitis C     Past Surgical History:  Procedure Laterality Date  . broken nose    . COLONOSCOPY  2008  . finegr ligament repair      Family History  Problem Relation Age of Onset  . Hypertension Mother   . Hypertension Father   . Hypertension Sister   . Hypertension Brother   . Colon cancer Neg Hx   . Colon polyps Neg Hx   . Esophageal cancer Neg Hx   . Rectal cancer Neg Hx   . Stomach cancer Neg Hx     Social History   Socioeconomic History  . Marital status: Married    Spouse name: Not on file  . Number of children: 6  . Years of education: 79  . Highest education level: Not on file  Occupational History  . Occupation: truck Hospital doctor  . Occupation: Education officer, environmental   Social Needs  . Financial resource strain: Not hard at all  . Food insecurity    Worry: Never true    Inability: Never true  . Transportation needs    Medical: No    Non-medical: No  Tobacco Use  . Smoking status: Former Smoker    Types: Cigarettes  . Smokeless tobacco: Never Used  . Tobacco comment: quit 20+ yrs ago   Substance and Sexual Activity  . Alcohol use: No  . Drug use: No  . Sexual activity: Not  Currently    Partners: Female    Comment: with monogamous partner  Lifestyle  . Physical activity    Days per week: 0 days    Minutes per session: 0 min  . Stress: Not at all  Relationships  . Social Musician on phone: Once a week    Gets together: Never    Attends religious service: More than 4 times per year    Active member of club or organization: Yes    Attends meetings of clubs or organizations: More than 4 times per year    Relationship status: Married  . Intimate partner violence    Fear of current or ex partner: No    Emotionally abused: No    Physically abused: No    Forced sexual activity: No  Other Topics Concern  . 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       Tobacco: none; quit 27 years ago.  Alcohol: none      Drugs: none       Exercise: none      Seatbelt: 100%; no texting             Outpatient Medications Prior to Visit  Medication Sig Dispense Refill  . atorvastatin (LIPITOR) 20 MG tablet Take 1 tablet (20 mg total) by mouth daily. 90 tablet 3  . CALCIUM MAGNESIUM 750 PO Take by mouth daily.    . mometasone (NASONEX) 50 MCG/ACT nasal spray Place 2 sprays into the nose daily. (Patient not taking: Reported on 11/29/2017) 17 g 12   No facility-administered medications prior to visit.     No Known Allergies  ROS Review of Systems  Constitutional: Negative.   HENT: Negative.   Eyes: Negative.  Negative for visual disturbance.  Respiratory: Positive for shortness of breath.   Cardiovascular: Positive for leg swelling. Negative for chest pain and palpitations.  Gastrointestinal: Negative.   Endocrine: Negative.   Genitourinary: Negative.   Musculoskeletal: Negative.   Skin: Negative.   Allergic/Immunologic: Negative.   Neurological: Positive for dizziness and  light-headedness. Negative for headaches.  Hematological: Negative.   Psychiatric/Behavioral: Negative.   All other systems reviewed and are negative.     Objective:    Physical Exam  Constitutional: He is oriented to person, place, and time. He appears well-developed and well-nourished. No distress.  HENT:  Head: Normocephalic and atraumatic.  Right Ear: External ear normal.  Left Ear: External ear normal.  Nose: Nose normal.  Mouth/Throat: Oropharynx is clear and moist.  Eyes: Pupils are equal, round, and reactive to light. Conjunctivae and EOM are normal. Right eye exhibits no discharge. Left eye exhibits no discharge. No scleral icterus.  Neck: Normal range of motion. Neck supple. No JVD present. No tracheal deviation present. No thyromegaly present.  Cardiovascular: Normal rate, regular rhythm and intact distal pulses. Exam reveals no gallop and no friction rub.  No murmur heard. s1 s2 normal, heart sounds distant. No s3, s4.   Pulmonary/Chest: Breath sounds normal. No respiratory distress. He has no wheezes. He has no rales. He exhibits no tenderness.  Musculoskeletal: Normal range of motion.        General: No tenderness, deformity or edema.  Lymphadenopathy:    He has no cervical adenopathy.  Neurological: He is oriented to person, place, and time. No cranial nerve deficit.  Skin: Skin is warm and dry. No rash noted. He is not diaphoretic. No erythema. No pallor.  Psychiatric: He has a normal mood and affect. His behavior is normal. Judgment and thought content normal.  Nursing note and vitals reviewed.   BP (!) 158/80   Pulse 78   Temp 99 F (37.2 C) (Oral)   Resp 16   Ht 5' 6.93" (1.7 m)   Wt 183 lb (83 kg)   SpO2 98%   BMI 28.72 kg/m  Wt Readings from Last 3 Encounters:  12/23/18 183 lb (83 kg)  11/29/17 177 lb (80.3 kg)  08/14/17 180 lb (81.6 kg)     There are no preventive care reminders to display for this patient.  There are no preventive care  reminders to display for this patient.  Lab Results  Component Value Date   TSH 1.650 11/29/2017   Lab Results  Component Value Date   WBC 6.3 11/29/2017   HGB 13.1 11/29/2017   HCT 42.0 11/29/2017   MCV 96 11/29/2017   PLT 212 11/29/2017   Lab Results  Component Value Date  NA 143 11/29/2017   K 4.4 11/29/2017   CO2 25 11/29/2017   GLUCOSE 98 11/29/2017   BUN 10 11/29/2017   CREATININE 1.34 (H) 11/29/2017   BILITOT 0.5 11/29/2017   ALKPHOS 68 11/29/2017   AST 14 11/29/2017   ALT 15 11/29/2017   PROT 7.1 11/29/2017   ALBUMIN 4.5 11/29/2017   CALCIUM 9.3 11/29/2017   Lab Results  Component Value Date   CHOL 167 11/29/2017   Lab Results  Component Value Date   HDL 47 11/29/2017   Lab Results  Component Value Date   LDLCALC 98 11/29/2017   Lab Results  Component Value Date   TRIG 109 11/29/2017   Lab Results  Component Value Date   CHOLHDL 3.6 11/29/2017   Lab Results  Component Value Date   HGBA1C 5.1 11/29/2017      Assessment & Plan:   Problem List Items Addressed This Visit    None    Visit Diagnoses    Dyspnea on exertion    -  Primary   Relevant Orders   EKG 12-Lead (Completed)   Ambulatory referral to Cardiology   Flu vaccine need       Relevant Orders   Flu Vaccine QUAD 36+ mos IM (Completed)   Screening for endocrine, metabolic and immunity disorder       Relevant Orders   CBC with Differential/Platelet   Comprehensive metabolic panel   Hemoglobin A1c   TSH   Lipid screening       Relevant Orders   Lipid panel   Nonspecific abnormal electrocardiogram (ECG) (EKG)       Relevant Orders   Ambulatory referral to Cardiology      No orders of the defined types were placed in this encounter.   Follow-up: No follow-ups on file.   PLAN  Referral to cardiology for abnormal EKG, DOE  Labs drawn, will follow up as warranted  Waiting on labs to initiate HTN and HLD control  Patient encouraged to call clinic with any questions,  comments, or concerns.   [name], NP

## 2018-12-23 NOTE — Patient Instructions (Signed)
° ° ° °  If you have lab work done today you will be contacted with your lab results within the next 2 weeks.  If you have not heard from us then please contact us. The fastest way to get your results is to register for My Chart. ° ° °IF you received an x-ray today, you will receive an invoice from [facility]. Please contact [facility] at [phone] with questions or concerns regarding your invoice.  ° °IF you received labwork today, you will receive an invoice from [facility]. Please contact [facility] at [phone] with questions or concerns regarding your invoice.  ° °Our billing staff will not be able to assist you with questions regarding bills from these companies. ° °You will be contacted with the lab results as soon as they are available. The fastest way to get your results is to activate your My Chart account. Instructions are located on the last page of this paperwork. If you have not heard from us regarding the results in 2 weeks, please contact this office. °  ° ° ° °

## 2018-12-24 LAB — COMPREHENSIVE METABOLIC PANEL
ALT: 20 IU/L (ref 0–44)
AST: 24 IU/L (ref 0–40)
Albumin/Globulin Ratio: 1.6 (ref 1.2–2.2)
Albumin: 4.5 g/dL (ref 3.8–4.8)
Alkaline Phosphatase: 86 IU/L (ref 39–117)
BUN/Creatinine Ratio: 10 (ref 10–24)
BUN: 13 mg/dL (ref 8–27)
Bilirubin Total: 0.5 mg/dL (ref 0.0–1.2)
CO2: 22 mmol/L (ref 20–29)
Calcium: 9.9 mg/dL (ref 8.6–10.2)
Chloride: 104 mmol/L (ref 96–106)
Creatinine, Ser: 1.36 mg/dL — ABNORMAL HIGH (ref 0.76–1.27)
GFR calc Af Amer: 63 mL/min/{1.73_m2} (ref >59)
GFR calc non Af Amer: 55 mL/min/{1.73_m2} — ABNORMAL LOW (ref >59)
Globulin, Total: 2.8 g/dL (ref 1.5–4.5)
Glucose: 82 mg/dL (ref 65–99)
Potassium: 4.3 mmol/L (ref 3.5–5.2)
Sodium: 141 mmol/L (ref 134–144)
Total Protein: 7.3 g/dL (ref 6.0–8.5)

## 2018-12-24 LAB — LIPID PANEL
Chol/HDL Ratio: 4 ratio (ref 0.0–5.0)
Cholesterol, Total: 179 mg/dL (ref 100–199)
HDL: 45 mg/dL (ref >39)
LDL Chol Calc (NIH): 118 mg/dL — ABNORMAL HIGH (ref 0–99)
Triglycerides: 84 mg/dL (ref 0–149)
VLDL Cholesterol Cal: 16 mg/dL (ref 5–40)

## 2018-12-24 LAB — CBC WITH DIFFERENTIAL/PLATELET
Basophils Absolute: 0.1 10*3/uL (ref 0.0–0.2)
Basos: 1 %
EOS (ABSOLUTE): 0.2 10*3/uL (ref 0.0–0.4)
Eos: 2 %
Hematocrit: 41.9 % (ref 37.5–51.0)
Hemoglobin: 13.9 g/dL (ref 13.0–17.7)
Immature Grans (Abs): 0 10*3/uL (ref 0.0–0.1)
Immature Granulocytes: 0 %
Lymphocytes Absolute: 2.8 10*3/uL (ref 0.7–3.1)
Lymphs: 38 %
MCH: 31.4 pg (ref 26.6–33.0)
MCHC: 33.2 g/dL (ref 31.5–35.7)
MCV: 95 fL (ref 79–97)
Monocytes Absolute: 0.7 10*3/uL (ref 0.1–0.9)
Monocytes: 9 %
Neutrophils Absolute: 3.8 10*3/uL (ref 1.4–7.0)
Neutrophils: 50 %
Platelets: 213 10*3/uL (ref 150–450)
RBC: 4.43 x10E6/uL (ref 4.14–5.80)
RDW: 11.3 % — ABNORMAL LOW (ref 11.6–15.4)
WBC: 7.6 10*3/uL (ref 3.4–10.8)

## 2018-12-24 LAB — HEMOGLOBIN A1C
Est. average glucose Bld gHb Est-mCnc: 103 mg/dL
Hgb A1c MFr Bld: 5.2 % (ref 4.8–5.6)

## 2018-12-24 LAB — TSH: TSH: 2.04 u[IU]/mL (ref 0.450–4.500)

## 2018-12-25 ENCOUNTER — Encounter: Payer: Self-pay | Admitting: Registered Nurse

## 2018-12-25 NOTE — Progress Notes (Signed)
Results letter sent to patient via MyChart Results not concerning, holding steady from previous labs  Kathrin Ruddy, NP

## 2019-01-01 ENCOUNTER — Other Ambulatory Visit: Payer: Self-pay

## 2019-01-01 ENCOUNTER — Ambulatory Visit (INDEPENDENT_AMBULATORY_CARE_PROVIDER_SITE_OTHER): Payer: BC Managed Care – PPO | Admitting: Cardiovascular Disease

## 2019-01-01 ENCOUNTER — Encounter: Payer: Self-pay | Admitting: Cardiovascular Disease

## 2019-01-01 DIAGNOSIS — I10 Essential (primary) hypertension: Secondary | ICD-10-CM

## 2019-01-01 MED ORDER — BLOOD PRESSURE CUFF MISC: 1.0000 | Freq: Every day | 0 refills | Status: AC

## 2019-01-01 NOTE — Patient Instructions (Signed)
Medication Instructions:  Your physician recommends that you continue on your current medications as directed. Please refer to the Current Medication list given to you today.  If you need a refill on your cardiac medications before your next appointment, please call your pharmacy.   Lab work: NONE If you have labs (blood work) drawn today and your tests are completely normal, you will receive your results only by: Marland Kitchen MyChart Message (if you have MyChart) OR . A paper copy in the mail If you have any lab test that is abnormal or we need to change your treatment, we will call you to review the results.  Testing/Procedures: NONE  Follow-Up: At [facility], you and your health needs are our priority.  As part of our continuing mission to provide you with exceptional heart care, we have created designated Provider Care Teams.  These Care Teams include your primary Cardiologist (physician) and Advanced Practice Providers (APPs -  Physician Assistants and Nurse Practitioners) who all work together to provide you with the care you need, when you need it. . You will need a follow up appointment in 6 months with Dr. Quay Burow.  Please call our office 2 months in advance to schedule this appointment.     Any Other Special Instructions Will Be Listed Below (If Applicable). KEEP A BLOOD PRESSURE LOG FOR 30 DAYS THEN FOLLOW UP WITH A CLINICAL PHARMACIST IN THE HYPERTENSION CLINIC. YOU WILL NEED AN APPOINTMENT.

## 2019-01-01 NOTE — Assessment & Plan Note (Signed)
Gregory Holloway does not have a history of hypertension although his blood pressure reading today is 171/109 and recently at his PCPs office was 158/80.  I have suggested that he obtain a home blood pressure monitor and keep that log every day for the next 30 days.  [name] will call him after that to review and initiate anti-hypertensive medications as needed.  I will see him back in 6 months for follow-up.

## 2019-01-01 NOTE — Progress Notes (Signed)
01/01/2019 Gregory Holloway.   1955/09/28  [id number]  Primary Physician [name], NP Primary Cardiologist: Runell Gess MD Milagros Loll, [place], [place]  HPI:  Gregory Holloway. is a 63 y.o. mildly overweight married African-American male father of 3 biologic children, 6 total children and grandfather to 7 grandchildren.  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  He was referred by [name], NP for evaluation of an abnormal EKG.  He has no cardiac risk factors although he is hypertensive today.  He has hepatitis C.  There is no family history for heart disease.  Never had a heart tach or stroke.  Denies chest pain or shortness of breath.  His EKG performed in his primary care provider's office 12/23/2018 revealed sinus rhythm at 60 with nonspecific ST and T wave changes.   No outpatient medications have been marked as taking for the 01/01/19 encounter (Office Visit) with Runell Gess, MD.     No Known Allergies  Social History   Socioeconomic History  . Marital status: Married    Spouse name: Not on file  . Number of children: 6  . Years of education: 71  . Highest education level: Not on file  Occupational History  . Occupation: truck Hospital doctor  . Occupation: Education officer, environmental   Social Needs  . Financial resource strain: Not hard at all  . Food insecurity    Worry: Never true    Inability: Never true  . Transportation needs    Medical: No    Non-medical: No  Tobacco Use  . Smoking status: Former Smoker    Types: Cigarettes  . Smokeless tobacco: Never Used  . Tobacco comment: quit 20+ yrs ago   Substance and Sexual Activity  . Alcohol use: No  . Drug use: No  . Sexual activity: Not Currently    Partners: Female    Comment: with monogamous partner  Lifestyle  . Physical activity    Days per week: 0 days    Minutes per session: 0 min  . Stress: Not at all  Relationships  . Social Musician on phone: Once a week    Gets together: Never    Attends religious service: More than 4 times per year    Active member of club or organization: Yes    Attends meetings of clubs or organizations: More than 4 times per year    Relationship status: Married  . Intimate partner violence    Fear of current or ex partner: No    Emotionally abused: No    Physically abused: No    Forced sexual activity: No  Other Topics Concern  . 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       Tobacco: none; quit 27 years ago.      Alcohol: none      Drugs: none       Exercise: none      Seatbelt: 100%; no texting              Review of Systems: General: negative for chills, fever, night sweats or weight changes.  Cardiovascular: negative for chest pain, dyspnea on exertion, edema, orthopnea,  palpitations, paroxysmal nocturnal dyspnea or shortness of breath Dermatological: negative for rash Respiratory: negative for cough or wheezing Urologic: negative for hematuria Abdominal: negative for nausea, vomiting, diarrhea, bright red blood per rectum, melena, or hematemesis Neurologic: negative for visual changes, syncope, or dizziness All other systems reviewed and are otherwise negative except as noted above.    Blood pressure (!) 171/109, pulse (!) 59, temperature (!) 97.3 F (36.3 C), height 5' 6.93" (1.7 m), weight 186 lb 9.6 oz (84.6 kg), SpO2 96 %.  General appearance: alert and no distress Neck: no adenopathy, no carotid bruit, no JVD, supple, symmetrical, trachea midline and thyroid not enlarged, symmetric, no tenderness/mass/nodules Lungs: clear to auscultation bilaterally Heart: regular rate and rhythm, S1, S2 normal, no murmur, click, rub or gallop Extremities: extremities normal, atraumatic, no cyanosis or edema Pulses: 2+ and symmetric Skin:  Skin color, texture, turgor normal. No rashes or lesions Neurologic: Alert and oriented X 3, normal strength and tone. Normal symmetric reflexes. Normal coordination and gait  EKG not performed today  ASSESSMENT AND PLAN:   Essential hypertension Mr. Baranek does not have a history of hypertension although his blood pressure reading today is 171/109 and recently at his PCPs office was 158/80.  I have suggested that he obtain a home blood pressure monitor and keep that log every day for the next 30 days.  [name] will call him after that to review and initiate anti-hypertensive medications as needed.  I will see him back in 6 months for follow-up.      Lorretta Harp MD FACP,FACC,FAHA, [facility] 01/01/2019 11:41 AM

## 2019-02-04 ENCOUNTER — Other Ambulatory Visit: Payer: Self-pay

## 2019-02-04 ENCOUNTER — Ambulatory Visit: Payer: BC Managed Care – PPO | Admitting: Pharmacist

## 2019-02-04 VITALS — BP 176/100 | HR 66 | Resp 15 | Ht 67.0 in | Wt 184.4 lb

## 2019-02-04 DIAGNOSIS — I10 Essential (primary) hypertension: Secondary | ICD-10-CM | POA: Diagnosis not present

## 2019-02-04 MED ORDER — AMLODIPINE BESYLATE 5 MG PO TABS: 5.0000 mg | ORAL_TABLET | Freq: Every day | ORAL | 1 refills | Status: DC

## 2019-02-04 NOTE — Patient Instructions (Addendum)
Return for a  follow up appointment in 8 weeks  Check your blood pressure at home daily (if able) and keep record of the readings.  Take your BP meds as follows: *START taking amlodipine 5mg  daily* *STAY hydrated  Bring all of your meds, your BP cuff and your record of home blood pressures to your next appointment.  Exercise as you're able, try to walk approximately 30 minutes per day.  Keep salt intake to a minimum, especially watch canned and prepared boxed foods.  Eat more fresh fruits and vegetables and fewer canned items.  Avoid eating in fast food restaurants.    HOW TO TAKE YOUR BLOOD PRESSURE: . Rest 5 minutes before taking your blood pressure. .  Don't smoke or drink caffeinated beverages for at least 30 minutes before. . Take your blood pressure before (not after) you eat. . Sit comfortably with your back supported and both feet on the floor (don't cross your legs). . Elevate your arm to heart level on a table or a desk. . Use the proper sized cuff. It should fit smoothly and snugly around your bare upper arm. There should be enough room to slip a fingertip under the cuff. The bottom edge of the cuff should be 1 inch above the crease of the elbow. . Ideally, take 3 measurements at one sitting and record the average.

## 2019-02-04 NOTE — Progress Notes (Signed)
Patient ID: Gregory Holloway.                 DOB: 03-06-1956                      MRN: [id number]     HPI: Gregory Holloway. is a 63 y.o. male referred by Dr. Gwenlyn Found to HTN clinic.  PMH includes uncontrolled hypertension and hepatin cirrhosis. During his last OV with Dr Gwenlyn Found BP was 171/109 but no medication therapy was initiated.  Patient presents for assessment and therapy initiation.   Patient denies headache, dizziness, swelling, increase fatigue, blurry vision or chest pains. 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 He is willing to start therapy today to control his BP and express support from his wife as well. She help him monitor his BP and cooks all his meals with low salt content.  Current HTN meds: none  Previously tried: none  BP goal: <130/80  Family History:HTN in mother and siblings  Social History: denies alcohol and tobacco   Diet: twice daily meals, truck driver, dry cereal for breakfast, salad and home cooked meal in evenings  Exercise: walking 2-3 miles per day  Home BP readings:  20 readings: average 160/90; HR 66-85bpm  Wt Readings from Last 3 Encounters:  02/04/19 184 lb 6.4 oz (83.6 kg)  01/01/19 186 lb 9.6 oz (84.6 kg)  12/23/18 183 lb (83 kg)   BP Readings from Last 3 Encounters:  02/04/19 (!) 176/100  01/01/19 (!) 171/109  12/23/18 (!) 158/80   Pulse Readings from Last 3 Encounters:  02/04/19 66  01/01/19 (!) 59  12/23/18 78    Past Medical History:  Diagnosis Date  . Allergy    Nasonex  . Hepatitis C     Current Outpatient Medications on File Prior to Visit  Medication Sig Dispense Refill  . Blood Pressure Monitoring (BLOOD PRESSURE CUFF) MISC 1 Package by Does not apply route daily. 1 each 0  . cholecalciferol (VITAMIN D3) 25 MCG (1000 UT) tablet Take 1,000 Units by mouth daily.     No current facility-administered medications on file prior to visit.     No Known Allergies  Blood pressure (!) 176/100, pulse 66, resp. rate 15,  height 5\' 7"  (1.702 m), weight 184 lb 6.4 oz (83.6 kg), SpO2 97 %.  Essential hypertension Blood pressure remains well above goal of 130/80. Patient currently working on positive lifestyle modification like low sodium diet and increased physical activity. Will start amlodipine 5mg  daily, increase daily hydration and follow up in 8 weeks. Plan to add chlorthalidone to therapy if additional BP control is needed during follow up visit.    Meliana Canner Rodriguez-Guzman PharmD, BCPS, [place] [id number] 02/09/2019 5:39 PM

## 2019-02-09 ENCOUNTER — Encounter: Payer: Self-pay | Admitting: Pharmacist

## 2019-02-09 NOTE — Assessment & Plan Note (Signed)
Blood pressure remains well above goal of 130/80. Patient currently working on positive lifestyle modification like low sodium diet and increased physical activity. Will start amlodipine 5mg  daily, increase daily hydration and follow up in 8 weeks. Plan to add chlorthalidone to therapy if additional BP control is needed during follow up visit.

## 2019-03-08 ENCOUNTER — Other Ambulatory Visit: Payer: Self-pay | Admitting: Cardiovascular Disease

## 2019-03-08 NOTE — Telephone Encounter (Signed)
New Message   *STAT* If patient is at the pharmacy, call can be transferred to refill team.   1. Which medications need to be refilled? (please list name of each medication and dose if known) amLODipine (NORVASC) 5 MG tablet  2. Which pharmacy/location (including street and city if local pharmacy) is medication to be sent to?[facility], [place] - [address] AT [address]  3. Do they need a 30 day or 90 day supply? 30 day  Patient only has 3 pills left, needs refilled asap.

## 2019-03-09 MED ORDER — AMLODIPINE BESYLATE 5 MG PO TABS: 5.0000 mg | ORAL_TABLET | Freq: Every day | ORAL | 10 refills | Status: DC

## 2019-04-01 ENCOUNTER — Other Ambulatory Visit: Payer: Self-pay

## 2019-04-01 ENCOUNTER — Ambulatory Visit: Payer: BC Managed Care – PPO | Admitting: Pharmacist

## 2019-04-01 VITALS — BP 128/70 | HR 86 | Resp 15 | Ht 66.0 in | Wt 183.2 lb

## 2019-04-01 DIAGNOSIS — I10 Essential (primary) hypertension: Secondary | ICD-10-CM | POA: Diagnosis not present

## 2019-04-01 MED ORDER — AMLODIPINE BESYLATE 5 MG PO TABS: 7.5000 mg | ORAL_TABLET | Freq: Every day | ORAL | 0 refills | Status: DC

## 2019-04-01 MED ORDER — AMLODIPINE BESYLATE 10 MG PO TABS: 10.0000 mg | ORAL_TABLET | Freq: Every day | ORAL | 1 refills | Status: DC

## 2019-04-01 NOTE — Patient Instructions (Addendum)
Return for a follow up appointment as needed  Check your blood pressure at home daily (if able) and keep record of the readings.  Take your BP meds as follows: *INCREAE amlodipine dose to 7.5mg  daily *  Bring all of your meds, your BP cuff and your record of home blood pressures to your next appointment.  Exercise as you're able, try to walk approximately 30 minutes per day.  Keep salt intake to a minimum, especially watch canned and prepared boxed foods.  Eat more fresh fruits and vegetables and fewer canned items.  Avoid eating in fast food restaurants.    HOW TO TAKE YOUR BLOOD PRESSURE: . Rest 5 minutes before taking your blood pressure. .  Don't smoke or drink caffeinated beverages for at least 30 minutes before. . Take your blood pressure before (not after) you eat. . Sit comfortably with your back supported and both feet on the floor (don't cross your legs). . Elevate your arm to heart level on a table or a desk. . Use the proper sized cuff. It should fit smoothly and snugly around your bare upper arm. There should be enough room to slip a fingertip under the cuff. The bottom edge of the cuff should be 1 inch above the crease of the elbow. . Ideally, take 3 measurements at one sitting and record the average.

## 2019-04-01 NOTE — Assessment & Plan Note (Signed)
Blood pressure is well controlled today during office visit, but home BP average remains above goal.  Patient denies problems with current therapy. Will increase amlodipine to 7.5mg  daily and by phone in 3 weeks.

## 2019-04-01 NOTE — Progress Notes (Signed)
Patient ID: Gregory Holloway.                 DOB: 1955-08-25                      MRN: [id number]     HPI: Gregory Holloway. is a 63 y.o. male referred by Dr. Gwenlyn Found to HTN clinic.  PMH includes uncontrolled hypertension and hepatin cirrhosis. During his last OV with Dr Gwenlyn Found BP was 171/109 but no medication therapy was initiated.  I saw him on Nov/2020 and amlodipjne 5mg  daily was initated amonotherapy. Patient presents today for follow up and denies swelling, SOB, dizziness or increase fatigue.   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 Allergy    Nasonex  . Hepatitis C     Current Outpatient Medications on File Prior to Visit  Medication Sig Dispense Refill  . Blood Pressure Monitoring (BLOOD PRESSURE CUFF) MISC 1 Package by Does not apply route daily. 1 each 0  . cholecalciferol (VITAMIN D3) 25 MCG (1000 UT) tablet Take 1,000 Units by mouth daily.     No current facility-administered medications on file prior to visit.    No Known Allergies  Blood pressure 128/70, pulse 86, resp. rate 15, height 5\' 6"  (1.676 m), weight 183 lb 3.2 oz (83.1 kg), SpO2 98 %.  Essential hypertension Blood pressure is well controlled today during office visit, but home BP average remains above goal.   Patient denies problems with current therapy. Will increase amlodipine to 7.5mg  daily and by phone in 3 weeks.   Mychaela Lennartz Rodriguez-Guzman PharmD, BCPS, [place] [phone] 04/01/2019 9:34 AM

## 2019-04-21 ENCOUNTER — Telehealth: Payer: Self-pay | Admitting: Pharmacist

## 2019-04-21 NOTE — Telephone Encounter (Signed)
LMOM; patient to call back to follow up. Tolerating amlodipine 7.5mg  daily? And Review recent BP readings.

## 2019-04-22 MED ORDER — AMLODIPINE BESYLATE 10 MG PO TABS: 10.0000 mg | ORAL_TABLET | Freq: Every day | ORAL | 1 refills | Status: DC

## 2019-04-22 NOTE — Addendum Note (Signed)
Addended by: Pearletha Furl on: 04/22/2019 09:00 AM   Modules accepted: Orders

## 2019-04-22 NOTE — Telephone Encounter (Signed)
BP "looking" good.  Recent BP readings:  138/85 149/85 149/90 150/79 141/83 132/73 139/71   Reccommendation: 1. Increase amlodipine to 10mg  daily 2. Call back if problems developed 3. Will follow up by phone in 4 weeks

## 2019-05-28 ENCOUNTER — Telehealth: Payer: Self-pay | Admitting: Pharmacist

## 2019-05-28 NOTE — Telephone Encounter (Signed)
No problems taking amlodipine 10mg . Denies ADR. BP 128/71, 142/86, 144/75, 139/77  Patient due to see DR in March. Will call back to schedule follow up ASAP>

## 2019-07-06 ENCOUNTER — Ambulatory Visit: Payer: BC Managed Care – PPO | Admitting: Cardiovascular Disease

## 2019-07-06 ENCOUNTER — Encounter: Payer: Self-pay | Admitting: Cardiovascular Disease

## 2019-07-06 ENCOUNTER — Other Ambulatory Visit: Payer: Self-pay

## 2019-07-06 VITALS — BP 130/72 | HR 69 | Ht 66.0 in | Wt 174.0 lb

## 2019-07-06 DIAGNOSIS — E782 Mixed hyperlipidemia: Secondary | ICD-10-CM | POA: Diagnosis not present

## 2019-07-06 DIAGNOSIS — E785 Hyperlipidemia, unspecified: Secondary | ICD-10-CM | POA: Insufficient documentation

## 2019-07-06 DIAGNOSIS — I10 Essential (primary) hypertension: Secondary | ICD-10-CM | POA: Diagnosis not present

## 2019-07-06 NOTE — Assessment & Plan Note (Signed)
History of essential hypertension better controlled with amlodipine with a blood pressure today 130/72 followed in our hypertension clinic.

## 2019-07-06 NOTE — Progress Notes (Signed)
07/06/2019 Gregory Holloway.   January 29, 1956  [id number]  Primary Physician Gregory Agee, NP Primary Cardiologist: Gregory Gess MD Gregory Holloway, [place], [place]  HPI:  Gregory Holloway. is a 64 y.o.   mildly overweight married African-American male father of 3 biologic children, 6 total children and grandfather to 7 grandchildren.  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  He was referred by Gregory Holloway , NP for evaluation of an abnormal EKG.   I last saw him in the office 01/01/2019.  He has no cardiac risk factors although he is hypertensive today.  He has hepatitis C.  There is no family history for heart disease.  Never had a heart tach or stroke.  Denies chest pain or shortness of breath.  His EKG performed in his primary care provider's office 12/23/2018 revealed sinus rhythm at 60 with nonspecific ST and T wave changes.  Since I saw him he has been followed by our Pharm.D. with medication titration.  He is currently on amlodipine 10 mg a day with a blood pressure that is well controlled.  He denies chest pain or shortness of breath.   Current Meds  Medication Sig  . amLODipine (NORVASC) 10 MG tablet Take 1 tablet (10 mg total) by mouth daily.  . Blood Pressure Monitoring (BLOOD PRESSURE CUFF) MISC 1 Package by Does not apply route daily.  . cholecalciferol (VITAMIN D3) 25 MCG (1000 UT) tablet Take 1,000 Units by mouth daily.     No Known Allergies  Social History   Socioeconomic History  . Marital status: Married    Spouse name: Not on file  . Number of children: 6  . Years of education: 98  . Highest education level: Not on file  Occupational History  . Occupation: truck Hospital doctor  . Occupation: pastor   Tobacco Use  . Smoking status: Former Smoker    Types: Cigarettes  . Smokeless tobacco: Never Used  . Tobacco comment: quit 20+ yrs ago   Substance and Sexual Activity  . Alcohol use: No  . Drug use: No  . Sexual activity: Not  Currently    Partners: Female    Comment: with monogamous partner  Other Topics Concern  . 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       Tobacco: none; quit 27 years ago.      Alcohol: none      Drugs: none       Exercise: none      Seatbelt: 100%; no texting            Social Determinants of Health   Financial Resource Strain:   . Difficulty of Paying Living Expenses:   Food Insecurity:   . Worried About Programme researcher, broadcasting/film/video in the Last Year:   . Barista in the Last Year:   Transportation Needs:   . Freight forwarder (Medical):   Marland Kitchen Lack of Transportation (Non-Medical):   Physical Activity:   . Days of Exercise per Week:   . Minutes of Exercise per Session:   Stress:   . Feeling of Stress :   Social Connections:   . Frequency of Communication  with Friends and Family:   . Frequency of Social Gatherings with Friends and Family:   . Attends Religious Services:   . Active Member of Clubs or Organizations:   . Attends Archivist Meetings:   Marland Kitchen Marital Status:   Intimate Partner Violence:   . Fear of Current or Ex-Partner:   . Emotionally Abused:   Marland Kitchen Physically Abused:   . Sexually Abused:      Review of Systems: General: negative for chills, fever, night sweats or weight changes.  Cardiovascular: negative for chest pain, dyspnea on exertion, edema, orthopnea, palpitations, paroxysmal nocturnal dyspnea or shortness of breath Dermatological: negative for rash Respiratory: negative for cough or wheezing Urologic: negative for hematuria Abdominal: negative for nausea, vomiting, diarrhea, bright red blood per rectum, melena, or hematemesis Neurologic: negative for visual changes, syncope, or dizziness All other systems reviewed and are otherwise negative except as noted  above.    Blood pressure 130/72, pulse 69, height 5\' 6"  (1.676 m), weight 174 lb (78.9 kg).  General appearance: alert and no distress Neck: no adenopathy, no carotid bruit, no JVD, supple, symmetrical, trachea midline and thyroid not enlarged, symmetric, no tenderness/mass/nodules Lungs: clear to auscultation bilaterally Heart: regular rate and rhythm, S1, S2 normal, no murmur, click, rub or gallop Extremities: extremities normal, atraumatic, no cyanosis or edema Pulses: 2+ and symmetric Skin: Skin color, texture, turgor normal. No rashes or lesions Neurologic: Alert and oriented X 3, normal strength and tone. Normal symmetric reflexes. Normal coordination and gait  EKG normal sinus rhythm at 69 with nonspecific ST and T wave changes.  I personally reviewed this EKG.  ASSESSMENT AND PLAN:   Essential hypertension History of essential hypertension better controlled with amlodipine with a blood pressure today 130/72 followed in our hypertension clinic.  Hyperlipidemia History of mild hyperlipidemia with lipid profile performed 12/23/2018 revealing total cholesterol 179, LDL 118 and HDL 45.      Lorretta Harp MD FACP,FACC,FAHA, [facility] 07/06/2019 10:42 AM

## 2019-07-06 NOTE — Patient Instructions (Signed)
Your physician recommends that you continue on your current medications as directed. Please refer to the Current Medication list given to you today.    Your physician recommends that you schedule a follow-up appointment in:  AS NEEDED 

## 2019-07-06 NOTE — Assessment & Plan Note (Signed)
History of mild hyperlipidemia with lipid profile performed 12/23/2018 revealing total cholesterol 179, LDL 118 and HDL 45.

## 2019-10-16 ENCOUNTER — Other Ambulatory Visit: Payer: Self-pay | Admitting: Cardiovascular Disease

## 2019-10-19 ENCOUNTER — Other Ambulatory Visit: Payer: Self-pay

## 2020-01-31 DIAGNOSIS — U071 COVID-19: Secondary | ICD-10-CM

## 2020-01-31 HISTORY — DX: COVID-19: U07.1

## 2020-02-07 ENCOUNTER — Other Ambulatory Visit: Payer: Self-pay

## 2020-02-07 ENCOUNTER — Encounter (HOSPITAL_COMMUNITY): Payer: Self-pay

## 2020-02-07 ENCOUNTER — Ambulatory Visit (HOSPITAL_COMMUNITY)
Admission: EM | Admit: 2020-02-07 | Discharge: 2020-02-07 | Disposition: A | Discharge status: Home / Self Care | Payer: BC Managed Care – PPO | Attending: Family | Admitting: Family

## 2020-02-07 DIAGNOSIS — Z79899 Other long term (current) drug therapy: Secondary | ICD-10-CM | POA: Insufficient documentation

## 2020-02-07 DIAGNOSIS — U071 COVID-19: Secondary | ICD-10-CM | POA: Diagnosis not present

## 2020-02-07 DIAGNOSIS — R0982 Postnasal drip: Secondary | ICD-10-CM | POA: Diagnosis not present

## 2020-02-07 DIAGNOSIS — I10 Essential (primary) hypertension: Secondary | ICD-10-CM | POA: Insufficient documentation

## 2020-02-07 DIAGNOSIS — Z87891 Personal history of nicotine dependence: Secondary | ICD-10-CM | POA: Insufficient documentation

## 2020-02-07 DIAGNOSIS — R051 Acute cough: Secondary | ICD-10-CM | POA: Diagnosis not present

## 2020-02-07 DIAGNOSIS — R059 Cough, unspecified: Secondary | ICD-10-CM

## 2020-02-07 LAB — SARS CORONAVIRUS 2 (TAT 6-24 HRS): SARS Coronavirus 2: POSITIVE — AB

## 2020-02-07 NOTE — Discharge Instructions (Addendum)
Recommend continue OTC cold and cough medication as needed. Rest. Stay at home. Follow-up pending COVID 19 test results.

## 2020-02-07 NOTE — ED Triage Notes (Signed)
Pt presents with chills and sneezing X 5 days.

## 2020-02-07 NOTE — ED Provider Notes (Signed)
[facility]    CSN: [id number] Arrival date & time: 02/07/20  1029      History   Chief Complaint Chief Complaint  Patient presents with   URI    HPI Gregory Holloway. is a 64 y.o. male.   64 year old male presents with chills, sneezing, nasal congestion and cough for the past 5 to 6 days. Denies any fever or GI symptoms. Is feeling better but work requires him to be COVID tested before returning. Some workers recently have tested positive. Has been fully vaccinated. Has taken OTC cold and cough tablets with some relief. Other chronic health issues include HTN. Currently on Norvasc and vit D daily.   The history is provided by the patient.    Past Medical History:  Diagnosis Date   Allergy    Nasonex   COVID-19 virus infection 01/2020   Essential hypertension    Hepatitis C     Patient Active Problem List   Diagnosis Date Noted   COVID-19 virus infection    Hyperlipidemia 07/06/2019   Essential hypertension 01/01/2019   Vaccine counseling 08/13/2016   Hepatic cirrhosis (HCC) 03/12/2016   Seasonal allergies 03/12/2016   Chronic hepatitis C without hepatic coma (HCC) 11/27/2013    Past Surgical History:  Procedure Laterality Date   broken nose     COLONOSCOPY  2008   finegr ligament repair         Home Medications    Prior to Admission medications   Medication Sig Start Date End Date Taking? Authorizing Provider  amLODipine (NORVASC) 10 MG tablet TAKE 1 TABLET(10 MG) BY MOUTH DAILY 10/18/19   Runell Gess, MD  Blood Pressure Monitoring (BLOOD PRESSURE CUFF) MISC 1 Package by Does not apply route daily. 01/01/19   Runell Gess, MD  cholecalciferol (VITAMIN D3) 25 MCG (1000 UT) tablet Take 1,000 Units by mouth daily.    [provider]    Family History Family History  Problem Relation Age of Onset   Hypertension Mother    Hypertension Father    Hypertension Sister    Hypertension Brother    Colon cancer  Neg Hx    Colon polyps Neg Hx    Esophageal cancer Neg Hx    Rectal cancer Neg Hx    Stomach cancer Neg Hx     Social History Social History   Tobacco Use   Smoking status: Former Smoker    Types: Cigarettes   Smokeless tobacco: Never Used   Tobacco comment: quit 20+ yrs ago   Vaping Use   Vaping Use: Never used  Substance Use Topics   Alcohol use: No   Drug use: No     Allergies   Patient has no known allergies.   Review of Systems Review of Systems  Constitutional: Positive for chills and fatigue. Negative for activity change, appetite change and fever.  HENT: Positive for congestion, postnasal drip and sneezing. Negative for ear discharge, ear pain, facial swelling, mouth sores, nosebleeds, sinus pressure, sinus pain, sore throat and trouble swallowing.   Eyes: Negative for pain, discharge, redness and itching.  Respiratory: Positive for cough. Negative for chest tightness, shortness of breath and wheezing.   Gastrointestinal: Negative for diarrhea, nausea and vomiting.  Musculoskeletal: Negative for arthralgias, myalgias, neck pain and neck stiffness.  Skin: Negative for color change and rash.  Allergic/Immunologic: Negative for environmental allergies, food allergies and immunocompromised state.  Neurological: Negative for dizziness, seizures, syncope, weakness, light-headedness and headaches.  Hematological: Negative for adenopathy. Does not bruise/bleed easily.     Physical Exam Triage Vital Signs ED Triage Vitals  Enc Vitals Group     BP 02/07/20 1213 (!) 144/87     Pulse Rate 02/07/20 1213 72     Resp 02/07/20 1213 17     Temp 02/07/20 1213 98.3 F (36.8 C)     Temp Source 02/07/20 1213 Oral     SpO2 02/07/20 1213 98 %     Weight --      Height --      Head Circumference --      Peak Flow --      Pain Score 02/07/20 1214 1     Pain Loc --      Pain Edu? --      Excl. in GC? --    No data found.  Updated Vital Signs BP (!) 144/87 (BP  Location: Right Arm)    Pulse 72    Temp 98.3 F (36.8 C) (Oral)    Resp 17    SpO2 98%   Visual Acuity Right Eye Distance:   Left Eye Distance:   Bilateral Distance:    Right Eye Near:   Left Eye Near:    Bilateral Near:     Physical Exam Vitals and nursing note reviewed.  Constitutional:      General: He is awake. He is not in acute distress.    Appearance: Normal appearance. He is well-developed and well-groomed. He is not ill-appearing.     Comments: He is sitting comfortably on the exam table in no acute distress.   HENT:     Head: Normocephalic and atraumatic.     Right Ear: Hearing, tympanic membrane, ear canal and external ear normal.     Left Ear: Hearing, tympanic membrane, ear canal and external ear normal.     Ears:     Comments: Soft brown cerumen partially blocking ear canals bilaterally.     Nose: Nose normal.     Right Sinus: No maxillary sinus tenderness or frontal sinus tenderness.     Left Sinus: No maxillary sinus tenderness or frontal sinus tenderness.     Mouth/Throat:     Lips: Pink.     Mouth: Mucous membranes are moist.     Pharynx: Uvula midline. Oropharyngeal exudate present. No pharyngeal swelling, posterior oropharyngeal erythema or uvula swelling.     Comments: Slight clear to yellow post nasal drainage present.  Eyes:     Extraocular Movements: Extraocular movements intact.     Conjunctiva/sclera: Conjunctivae normal.  Cardiovascular:     Rate and Rhythm: Normal rate and regular rhythm.     Heart sounds: Normal heart sounds. No murmur heard.   Pulmonary:     Effort: Pulmonary effort is normal. No respiratory distress.     Breath sounds: Normal breath sounds and air entry. No decreased air movement. No decreased breath sounds, wheezing, rhonchi or rales.  Musculoskeletal:     Cervical back: Normal range of motion and neck supple. No rigidity.  Lymphadenopathy:     Cervical: No cervical adenopathy.  Skin:    General: Skin is warm and dry.      Capillary Refill: Capillary refill takes less than 2 seconds.     Findings: No rash.  Neurological:     General: No focal deficit present.     Mental Status: He is alert and oriented to person, place, and time.  Psychiatric:  Mood and Affect: Mood normal.        Behavior: Behavior normal. Behavior is cooperative.        Thought Content: Thought content normal.        Judgment: Judgment normal.      UC Treatments / Results  Labs (all labs ordered are listed, but only abnormal results are displayed) Labs Reviewed  SARS CORONAVIRUS 2 (TAT 6-24 HRS) - Abnormal; Notable for the following components:      Result Value   SARS Coronavirus 2 POSITIVE (*)    All other components within normal limits    EKG   Radiology No results found.  Procedures Procedures (including critical care time)  Medications Ordered in UC Medications - No data to display  Initial Impression / Assessment and Plan / UC Course  I have reviewed the triage vital signs and the nursing notes.  Pertinent labs & imaging results that were available during my care of the patient were reviewed by me and considered in my medical decision making (see chart for details).     Reviewed with patient that he probably has a viral illness. Recommend continue OTC cold and cough medication as directed as needed. Continue to push fluids to help loosen up any mucus in sinuses and chest. Rest. Stay at home. Note written for work. Follow-up pending COVID 19 test results.   Final Clinical Impressions(s) / UC Diagnoses   Final diagnoses:  Cough  Post-nasal drainage     Discharge Instructions     Recommend continue OTC cold and cough medication as needed. Rest. Stay at home. Follow-up pending COVID 19 test results.     ED Prescriptions    None     PDMP not reviewed this encounter.   Sudie Grumbling, NP 02/08/20 1138

## 2020-02-08 ENCOUNTER — Other Ambulatory Visit (HOSPITAL_COMMUNITY): Payer: Self-pay | Admitting: Physician Assistant

## 2020-02-08 ENCOUNTER — Encounter: Payer: Self-pay | Admitting: Physician Assistant

## 2020-02-08 ENCOUNTER — Encounter: Payer: Self-pay | Admitting: Nurse Practitioner

## 2020-02-08 ENCOUNTER — Telehealth: Payer: Self-pay | Admitting: Nurse Practitioner

## 2020-02-08 DIAGNOSIS — U071 COVID-19: Secondary | ICD-10-CM | POA: Insufficient documentation

## 2020-02-08 NOTE — Telephone Encounter (Signed)
Called patient to discuss Covid symptoms and the use of casirivimab/imdevimab, a monoclonal antibody infusion for those with mild to moderate Covid symptoms and at a high risk of hospitalization.  Pt is qualified for this infusion at the [facility] due to; Specific high risk criteria : Older age (>/= 65 yo); HTN   Message left to call back our hotline [phone].  Nicolasa Ducking, NP

## 2020-02-08 NOTE — Progress Notes (Signed)
I connected by phone with Gregory Holloway. on 02/08/2020 at 4:33 PM to discuss the potential use of a new treatment for mild to moderate COVID-19 viral infection in non-hospitalized patients.  This patient is a 64 y.o. male that meets the FDA criteria for Emergency Use Authorization of COVID monoclonal antibody casirivimab/imdevimab, bamlanivimab/eteseviamb, or sotrovimab.  Has a (+) direct SARS-CoV-2 viral test result  Has mild or moderate COVID-19   Is NOT hospitalized due to COVID-19  Is within 10 days of symptom onset  Has at least one of the high risk factor(s) for progression to severe COVID-19 and/or hospitalization as defined in EUA.  Specific high risk criteria : Older age (>/= 65 yo) and Cardiovascular disease or hypertension   I have spoken and communicated the following to the patient or parent/caregiver regarding COVID monoclonal antibody treatment:  1. FDA has authorized the emergency use for the treatment of mild to moderate COVID-19 in adults and pediatric patients with positive results of direct SARS-CoV-2 viral testing who are 29 years of age and older weighing at least 40 kg, and who are at high risk for progressing to severe COVID-19 and/or hospitalization.  2. The significant known and potential risks and benefits of COVID monoclonal antibody, and the extent to which such potential risks and benefits are unknown.  3. Information on available alternative treatments and the risks and benefits of those alternatives, including clinical trials.  4. Patients treated with COVID monoclonal antibody should continue to self-isolate and use infection control measures (e.g., wear mask, isolate, social distance, avoid sharing personal items, clean and disinfect "high touch" surfaces, and frequent handwashing) according to CDC guidelines.   5. The patient or parent/caregiver has the option to accept or refuse COVID monoclonal antibody treatment.  After reviewing this information  with the patient, the patient has agreed to receive one of the available covid 19 monoclonal antibodies and will be provided an appropriate fact sheet prior to infusion. Jodell Cipro, PA-C 02/08/2020 4:33 PM

## 2020-02-09 ENCOUNTER — Ambulatory Visit (HOSPITAL_COMMUNITY)
Admission: RE | Admit: 2020-02-09 | Discharge: 2020-02-09 | Disposition: A | Discharge status: Home / Self Care | Payer: BC Managed Care – PPO | Source: Ambulatory Visit | Attending: Pulmonary Disease | Admitting: Pulmonary Disease

## 2020-02-09 DIAGNOSIS — U071 COVID-19: Secondary | ICD-10-CM | POA: Diagnosis not present

## 2020-02-09 MED ORDER — FAMOTIDINE IN NACL 20-0.9 MG/50ML-% IV SOLN: 20.0000 mg | Freq: Once | INTRAVENOUS | Status: DC | PRN

## 2020-02-09 MED ORDER — METHYLPREDNISOLONE SODIUM SUCC 125 MG IJ SOLR: 125.0000 mg | Freq: Once | INTRAMUSCULAR | Status: DC | PRN

## 2020-02-09 MED ORDER — DIPHENHYDRAMINE HCL 50 MG/ML IJ SOLN: 50.0000 mg | Freq: Once | INTRAMUSCULAR | Status: DC | PRN

## 2020-02-09 MED ORDER — SODIUM CHLORIDE 0.9 % IV SOLN: INTRAVENOUS | Status: DC | PRN

## 2020-02-09 MED ORDER — SOTROVIMAB 500 MG/8ML IV SOLN
500.0000 mg | Freq: Once | INTRAVENOUS | Status: AC
  Administered 2020-02-09: 500 mg via INTRAVENOUS

## 2020-02-09 MED ORDER — ALBUTEROL SULFATE HFA 108 (90 BASE) MCG/ACT IN AERS: 2.0000 | INHALATION_SPRAY | Freq: Once | RESPIRATORY_TRACT | Status: DC | PRN

## 2020-02-09 MED ORDER — EPINEPHRINE 0.3 MG/0.3ML IJ SOAJ: 0.3000 mg | Freq: Once | INTRAMUSCULAR | Status: DC | PRN

## 2020-02-09 NOTE — Discharge Instructions (Signed)

## 2020-02-09 NOTE — Progress Notes (Signed)
  Diagnosis: COVID-19  Physician: Patrick Wright, MD  Procedure: Sotrovimab  Complications: No immediate complications noted.  Discharge: Discharged home   Breuna Loveall N Lashunta Frieden 02/09/2020   

## 2020-04-13 ENCOUNTER — Other Ambulatory Visit: Payer: Self-pay | Admitting: Cardiovascular Disease

## 2020-10-07 ENCOUNTER — Other Ambulatory Visit: Payer: Self-pay | Admitting: Cardiovascular Disease

## 2020-10-18 DIAGNOSIS — Z1339 Encounter for screening examination for other mental health and behavioral disorders: Secondary | ICD-10-CM | POA: Diagnosis not present

## 2020-10-18 DIAGNOSIS — I10 Essential (primary) hypertension: Secondary | ICD-10-CM | POA: Diagnosis not present

## 2020-10-18 DIAGNOSIS — E559 Vitamin D deficiency, unspecified: Secondary | ICD-10-CM | POA: Diagnosis not present

## 2020-10-18 DIAGNOSIS — R5383 Other fatigue: Secondary | ICD-10-CM | POA: Diagnosis not present

## 2020-10-18 DIAGNOSIS — Z1159 Encounter for screening for other viral diseases: Secondary | ICD-10-CM | POA: Diagnosis not present

## 2020-10-18 DIAGNOSIS — Z0000 Encounter for general adult medical examination without abnormal findings: Secondary | ICD-10-CM | POA: Diagnosis not present

## 2020-10-18 DIAGNOSIS — R0602 Shortness of breath: Secondary | ICD-10-CM | POA: Diagnosis not present

## 2020-10-18 DIAGNOSIS — Z125 Encounter for screening for malignant neoplasm of prostate: Secondary | ICD-10-CM | POA: Diagnosis not present

## 2020-10-18 DIAGNOSIS — Z6827 Body mass index (BMI) 27.0-27.9, adult: Secondary | ICD-10-CM | POA: Diagnosis not present

## 2020-10-18 DIAGNOSIS — Z1331 Encounter for screening for depression: Secondary | ICD-10-CM | POA: Diagnosis not present

## 2020-11-13 DIAGNOSIS — R9431 Abnormal electrocardiogram [ECG] [EKG]: Secondary | ICD-10-CM | POA: Diagnosis not present

## 2021-01-18 DIAGNOSIS — E559 Vitamin D deficiency, unspecified: Secondary | ICD-10-CM | POA: Diagnosis not present

## 2021-01-18 DIAGNOSIS — I10 Essential (primary) hypertension: Secondary | ICD-10-CM | POA: Diagnosis not present

## 2021-01-18 DIAGNOSIS — Z23 Encounter for immunization: Secondary | ICD-10-CM | POA: Diagnosis not present

## 2021-01-18 DIAGNOSIS — Z6828 Body mass index (BMI) 28.0-28.9, adult: Secondary | ICD-10-CM | POA: Diagnosis not present

## 2021-02-09 DIAGNOSIS — Q613 Polycystic kidney, unspecified: Secondary | ICD-10-CM | POA: Diagnosis not present

## 2021-02-09 DIAGNOSIS — N189 Chronic kidney disease, unspecified: Secondary | ICD-10-CM | POA: Diagnosis not present

## 2021-02-09 DIAGNOSIS — I129 Hypertensive chronic kidney disease with stage 1 through stage 4 chronic kidney disease, or unspecified chronic kidney disease: Secondary | ICD-10-CM | POA: Diagnosis not present

## 2021-08-09 ENCOUNTER — Other Ambulatory Visit: Payer: Self-pay

## 2021-08-09 ENCOUNTER — Emergency Department (HOSPITAL_COMMUNITY)
Admission: EM | Admit: 2021-08-09 | Discharge: 2021-08-09 | Disposition: A | Discharge status: Home / Self Care | Payer: Medicare HMO | Attending: Emergency Medicine | Admitting: Emergency Medicine

## 2021-08-09 ENCOUNTER — Emergency Department (HOSPITAL_COMMUNITY): Payer: Medicare HMO

## 2021-08-09 ENCOUNTER — Encounter (HOSPITAL_COMMUNITY): Payer: Self-pay | Admitting: Emergency Medicine

## 2021-08-09 DIAGNOSIS — M545 Low back pain, unspecified: Secondary | ICD-10-CM

## 2021-08-09 DIAGNOSIS — M5137 Other intervertebral disc degeneration, lumbosacral region: Secondary | ICD-10-CM | POA: Diagnosis not present

## 2021-08-09 MED ORDER — HYDROCODONE-ACETAMINOPHEN 5-325 MG PO TABS: 1.0000 | ORAL_TABLET | ORAL | 0 refills | Status: AC | PRN

## 2021-08-09 NOTE — ED Provider Triage Note (Signed)
Emergency Medicine Provider Triage Evaluation Note ? ?Gregory Holloway. , a 66 y.o. male  was evaluated in triage.  Pt complains of low back pain.  He states he had a recent flare last week and had an x-ray done and told it was osteoarthritis.  He was given steroids and muscle relaxer with improvement in his symptoms.  He has a recurrence of the flare today.  States pain is worsened today compared to last week.  Denies fever, chills, weakness in his lower extremities, saddle anesthesia, loss of bowel or bladder control.  Denies history of cancer.  ? ?Review of Systems  ?Positive: As above ?Negative: As above ? ?Physical Exam  ?BP (!) 152/73   Pulse 84   Temp 98.5 ?F (36.9 ?C) (Oral)   Resp 18   SpO2 99%  ?Gen:   Awake, no distress   ?Resp:  Normal effort  ?MSK:   Moves extremities without difficulty  ?Other:  Without lumbar spine tenderness.  5/5 strength in bilateral lower extremities. ? ?Medical Decision Making  ?Medically screening exam initiated at 8:11 PM.  Appropriate orders placed.  Gregory Holloway. was informed that the remainder of the evaluation will be completed by another provider, this initial triage assessment does not replace that evaluation, and the importance of remaining in the ED until their evaluation is complete. ? ? ?  ?Marita Kansas, PA-C ?08/09/21 2023 ? ?

## 2021-08-09 NOTE — ED Triage Notes (Signed)
Back pain for weeks, has been seen at [facility] hospital previously and given medication (steroid and muscle relaxer), had been helping but now having worse pain in low back on both sides. ? ?Denies urinary sx, fever, CP, SOB.  ?

## 2021-08-09 NOTE — Discharge Instructions (Signed)
The CAT scan shows some degenerative disc changes at L5-S1.  This is likely contributing to your discomfort.  We are giving you a pain reliever to help control the pain.  Do not drive or drink alcohol when taking it.  Use heat on the sore area 3-4 times a day and try to gently stretch her back.  Follow-up with your doctor at the [facility] if not better in 3 or 4 days. ?

## 2021-08-09 NOTE — ED Notes (Signed)
Discharge instructions reviewed with pt by rn. He denies any further questions or concerns. Pt was ambulatory. ?

## 2021-08-09 NOTE — ED Provider Notes (Signed)
?[facility] EMERGENCY DEPARTMENT ?Provider Note ? ? ?CSN: [id number] ?Arrival date & time: 08/09/21  2000 ? ?  ? ?History ? ?Chief Complaint  ?Patient presents with  ? Back Pain  ? ? ?Gregory Holloway. is a 66 y.o. male. ? ?HPI ?Presenting for lower back pain, bilateral, without trauma for 1 month.  Partial improvement when he took methylprednisolone and a muscle relaxer, about 2 weeks ago prescribed by the [place].  No recent trauma or illness including fever.  Does not have chronic back pain.  [facility] then got [facility] again today. ?  ? ?Home Medications ?Prior to Admission medications   ?Medication Sig Start Date End Date Taking? Authorizing Provider  ?HYDROcodone-acetaminophen (NORCO/VICODIN) 5-325 MG tablet Take 1 tablet by mouth every 4 (four) hours as needed for moderate pain or severe pain. 08/09/21  Yes Mancel Bale, MD  ?amLODipine (NORVASC) 10 MG tablet TAKE 1 TABLET(10 MG) BY MOUTH DAILY 10/09/20   Runell Gess, MD  ?Blood Pressure Monitoring (BLOOD PRESSURE CUFF) MISC 1 Package by Does not apply route daily. 01/01/19   Runell Gess, MD  ?cholecalciferol (VITAMIN D3) 25 MCG (1000 UT) tablet Take 1,000 Units by mouth daily.    [provider]  ?   ? ?Allergies    ?Patient has no known allergies.   ? ?Review of Systems   ?Review of Systems ? ?Physical Exam ?Updated Vital Signs ?BP 118/68   Pulse 65   Temp 98.5 ?F (36.9 ?C) (Oral)   Resp 18   Ht 5\' 6"  (1.676 m)   Wt 79.4 kg   SpO2 99%   BMI 28.25 kg/m?  ?Physical Exam ?Vitals and nursing note reviewed.  ?Constitutional:   ?   General: He is not in acute distress. ?   Appearance: He is well-developed. He is not ill-appearing.  ?HENT:  ?   Head: Normocephalic and atraumatic.  ?   Right Ear: External ear normal.  ?   Left Ear: External ear normal.  ?Eyes:  ?   Conjunctiva/sclera: Conjunctivae normal.  ?   Pupils: Pupils are equal, round, and reactive to light.  ?Neck:  ?   Trachea: Phonation  normal.  ?Cardiovascular:  ?   Rate and Rhythm: Normal rate.  ?Pulmonary:  ?   Effort: Pulmonary effort is normal.  ?Abdominal:  ?   General: There is no distension.  ?Musculoskeletal:     ?   General: Normal range of motion.  ?   Cervical back: Normal range of motion and neck supple.  ?   Comments: Mild tenderness lower lumbar, bilaterally, without palpable deformity  ?Skin: ?   General: Skin is warm and dry.  ?Neurological:  ?   Mental Status: He is alert and oriented to person, place, and time.  ?   Cranial Nerves: No cranial nerve deficit.  ?   Sensory: No sensory deficit.  ?   Motor: No abnormal muscle tone.  ?   Coordination: Coordination normal.  ?Psychiatric:     ?   Mood and Affect: Mood normal.     ?   Behavior: Behavior normal.     ?   Thought Content: Thought content normal.     ?   Judgment: Judgment normal.  ? ? ?ED Results / Procedures / Treatments   ?Labs ?(all labs ordered are listed, but only abnormal results are displayed) ?Labs Reviewed - No data to display ? ?EKG ?None ? ?Radiology ?  CT Lumbar Spine Wo Contrast ? ?Result Date: 08/09/2021 ?CLINICAL DATA:  Low back pain. EXAM: CT LUMBAR SPINE WITHOUT CONTRAST TECHNIQUE: Multidetector CT imaging of the lumbar spine was performed without intravenous contrast administration. Multiplanar CT image reconstructions were also generated. RADIATION DOSE REDUCTION: This exam was performed according to the departmental dose-optimization program which includes automated exposure control, adjustment of the mA and/or kV according to patient size and/or use of iterative reconstruction technique. COMPARISON:  None Available. FINDINGS: Segmentation: There are five lumbar type vertebral bodies. The last full intervertebral disc space is labeled L5-S1. Alignment: Normal Vertebrae: No bone lesions or fractures are identified. The facets are normally aligned. No pars defects. Large near bridging syndesmophyte noted at L2-3. Paraspinal and other soft tissues: Polycystic  kidney disease noted bilaterally. Aortic calcifications without aneurysm. No retroperitoneal adenopathy. The celiac axis and SMA come off the aorta as a single trunk. Disc levels: T12-L1: No significant findings. L1-2: No significant findings. L2-3: Mild diffuse annular bulge but no disc protrusion, spinal or foraminal stenosis. L3-4: Mild annular bulge but no spinal or foraminal stenosis. L4-5: No significant findings. L5-S1: Degenerative disc disease with mild disc space narrowing and slight discogenic sclerosis. There is a shallow central disc extrusion with flattening of the ventral thecal sac. The exiting L5 nerve roots are normal. IMPRESSION: 1. Normal alignment and no acute bony findings. 2. Shallow central disc extrusion at L5-S1 with flattening of the ventral thecal sac. The exiting L5 nerve roots are normal. 3. Polycystic kidney disease. 4. Aortic atherosclerosis. Aortic Atherosclerosis (ICD10-I70.0). Electronically Signed   By: Rudie MeyerP.  Gallerani M.D.   On: 08/09/2021 20:59   ? ?Procedures ?Procedures  ? ? ?Medications Ordered in ED ?Medications - No data to display ? ?ED Course/ Medical Decision Making/ A&P ?  ?                        ?Medical Decision Making ?Ongoing back pain for 1 month without trauma.  History of degenerative joint disease.  No recent trauma.  No clinical evidence for cauda equina syndrome.  Recent treatment with prednisone, and some type of muscle relaxer. ? ?Problems Addressed: ?Bilateral low back pain without sciatica, unspecified chronicity: chronic illness or injury ?   Details: With acute exacerbation ?Degenerative disc disease at L5-S1 level: chronic illness or injury ?   Details: Etiology not clear. ? ?Amount and/or Complexity of Data Reviewed ?Independent Historian:  ?   Details: Cogent historian ?Radiology: ordered and independent interpretation performed. ?   Details: Lumbar CT-degenerative changes without fracture or significant spinal stenosis.  No evidence for significant  nerve compression. ? ?Risk ?Prescription drug management. ?Decision regarding hospitalization. ?Risk Details: Patient with chronic back pain, marginally better after trial of steroid medication.  No evidence for spinal myelopathy, fracture, discitis or severe pain.  Patient does not require hospitalization for management.  Symptomatic treatment indicated with narcotic analgesia, heat and gradually advancing activity.  Referred back to his ongoing medical providers for management.  He does not require hospitalization at this time. ? ? ? ? ? ? ? ? ? ? ?Final Clinical Impression(s) / ED Diagnoses ?Final diagnoses:  ?Bilateral low back pain without sciatica, unspecified chronicity  ?Degenerative disc disease at L5-S1 level  ? ? ?Rx / DC Orders ?ED Discharge Orders   ? ?      Ordered  ?  HYDROcodone-acetaminophen (NORCO/VICODIN) 5-325 MG tablet  Every 4 hours PRN       ? 08/09/21 2258  ? ?  ?  ? ?  ? ? ?  ?  Mancel Bale, MD ?08/10/21 1621 ? ?

## 2021-09-24 NOTE — Therapy (Addendum)
OUTPATIENT PHYSICAL THERAPY EVALUATION  DISCHARGE   Patient Name: Gregory Holloway. MRN: [id number] DOB:1956/03/19, 66 y.o., male Today's Date: 09/25/2021   PT End of Session - 09/25/21 1008     Visit Number 1    Number of Visits 6    Date for PT Re-Evaluation 11/06/21    Authorization Type [employer] MCR    PT Start Time 1000    PT Stop Time 1045    PT Time Calculation (min) 45 min    Activity Tolerance Patient tolerated treatment well    Behavior During Therapy [facility] for tasks assessed/performed             Past Medical History:  Diagnosis Date   Allergy    Nasonex   COVID-19 virus infection 01/2020   Essential hypertension    Hepatitis C    Past Surgical History:  Procedure Laterality Date   broken nose     COLONOSCOPY  2008   finegr ligament repair     Patient Active Problem List   Diagnosis Date Noted   COVID-19 virus infection    Hyperlipidemia 07/06/2019   Essential hypertension 01/01/2019   Vaccine counseling 08/13/2016   Hepatic cirrhosis ([place]) 03/12/2016   Seasonal allergies 03/12/2016   Chronic hepatitis C without hepatic coma ([place]) 11/27/2013    PCP: Pcp, No  REFERRING PROVIDER: Dulce Sellar, MD  REFERRING DIAG: Dorsalgia  Rationale for Evaluation and Treatment Rehabilitation  THERAPY DIAG:  Other low back pain  Muscle weakness (generalized)  ONSET DATE: March 2023   SUBJECTIVE:         SUBJECTIVE STATEMENT: Patient reports he was told he has some arthritis in his lower back, he was given some medication so is feeling pretty good right now. He just has some soreness in the lower back when he gets up in the morning, but when he gets moving he feels better. Patient states the pain began in March 2023. Denies any leg pain or numbness/tingling.   PERTINENT HISTORY:  None  PAIN:  Are you having pain? Yes:  NPRS scale: 0/10 (3-4/10 in the morning) Pain location: Lower back Pain description: Intermittent, sore, stiff Aggravating factors:  Pain mainly occurs in morning Relieving factors: Movement, medication  PRECAUTIONS: None  WEIGHT BEARING RESTRICTIONS No  FALLS:  Has patient fallen in last 6 months? No  LIVING ENVIRONMENT: Lives with: lives with their family  OCCUPATION: Retired  PLOF: Independent  PATIENT GOALS: Pain relief in the morning   OBJECTIVE:  PATIENT SURVEYS:  FOTO 72% functional status  SCREENING FOR RED FLAGS: Negative  COGNITION: Overall cognitive status: Within functional limits for tasks assessed     SENSATION: WFL  MUSCLE LENGTH: Patient demonstrates limitations with bilat hamstrings  POSTURE:   Rounded shoulder posture  PALPATION: Non tender to palpation  LUMBAR ROM:    AROM  eval  Flexion 75%  Extension 75%  Right lateral flexion 75%  Left lateral flexion 75%  Right rotation 75%  Left rotation 75%   LOWER EXTREMITY ROM:      LE ROM grossly WFL  LOWER EXTREMITY MMT:    MMT Right eval Left eval  Hip flexion 4 4  Hip extension 4- 4-  Hip abduction 4- 4-  Knee flexion 5 5  Knee extension 5 5   GAIT: Assistive device utilized: None Level of assistance: Complete Independence Comments: WFL   TODAY'S TREATMENT  LTR 10 x 5 sec Prone press up 10 x 5 sec Child's pose 3 x 20  sec Bridge x 10 90-90 alternating foot tap x 10 Sidelying hip abduction x 10 each   PATIENT EDUCATION:  Education details: Exam findings, POC, HEP Person educated: Patient Education method: Explanation, Demonstration, Tactile cues, Verbal cues, and Handouts Education comprehension: verbalized understanding, returned demonstration, verbal cues required, tactile cues required, and needs further education  HOME EXERCISE PROGRAM: Access Code: [email or web address]   ASSESSMENT: CLINICAL IMPRESSION: Patient is a 66 y.o. male who was seen today for physical therapy evaluation and treatment for chronic low back pain. He reports his back pain has been improving and only bothers him in the mornings.  He demonstrates slight limitation in lumbar mobility with hamstring flexibility deficits, and gross strength deficit of the core and hips which he was provided exercises to address. Patient does not have any radicular symptoms.    OBJECTIVE IMPAIRMENTS decreased ROM, decreased strength, impaired flexibility, and pain.   ACTIVITY LIMITATIONS bending and sleeping  PARTICIPATION LIMITATIONS:  n/a  PERSONAL FACTORS Fitness and Time since onset of injury/illness/exacerbation are also affecting patient's functional outcome.   REHAB POTENTIAL: Good  CLINICAL DECISION MAKING: Stable/uncomplicated  EVALUATION COMPLEXITY: Low   GOALS: Goals reviewed with patient? Yes  SHORT TERM GOALS: Target date: 10/16/2021  Patient will be I with initial HEP in order to progress with therapy. Baseline: HEP provided at eval Goal status: INITIAL  2.  PT will review FOTO with patient by 3rd visit in order to understand expected progress and outcome with therapy. Baseline: FOTO assessed at eval Goal status: INITIAL  3.  Patient will report pain in the morning </= 2/10 in order to improve mobility and reduce functional limitations Baseline: 3-4/10 pain in the morning Goal status: INITIAL  LONG TERM GOALS: Target date: 11/06/2021  Patient will be I with final HEP to maintain progress from PT. Baseline: HEP provided at eval Goal status: INITIAL  2.  Patient will report >/= 79% status on FOTO to indicate improved functional ability. Baseline: 72% Goal status: INITIAL  3.  Patient will demonstrate lumbar AROM grossly WFL to improve mobility and forward bend. Baseline: patient exhibits 75% of WFL movement Goal status: INITIAL  4.  Patient will demonstrate gross core and hip strength >/= 4/5 MMT in order to improve lumbar control with functional movements. Baseline: grossly 4-/5 MMT Goal status: INITIAL   PLAN: PT FREQUENCY: 1x/week  PT DURATION: 6 weeks  PLANNED INTERVENTIONS: Therapeutic  exercises, Therapeutic activity, Neuromuscular re-education, Balance training, Gait training, Patient/Family education, Joint manipulation, Joint mobilization, Aquatic Therapy, Dry Needling, Electrical stimulation, Spinal manipulation, Spinal mobilization, Cryotherapy, Moist heat, Manual therapy, and Re-evaluation.  PLAN FOR NEXT SESSION: Review HEP and progress PRN, continue with lumbar mobility and hamstring flexibility, progress core and hip strengthening   Hilda Blades, PT, DPT, LAT, ATC 09/25/21  1:16 PM Phone: [phone] Fax: [phone]   Referring diagnosis? M54.9 Treatment diagnosis? (if different than referring diagnosis) M54.99 What was this (referring dx) caused by? []  Surgery []  Fall [x]  Ongoing issue [x]  Arthritis []  Other: ____________  Laterality: []  Rt []  Lt [x]  Both  Check all possible CPT codes:  *CHOOSE 10 OR LESS*    []  97110 (Therapeutic Exercise)  []  92507 (SLP Treatment)  []  97112 (Neuro Re-ed)   []  92526 (Swallowing Treatment)   []  97116 (Gait Training)   []  [id number] (Cognitive Training, 1st 15 minutes) []  97140 (Manual Therapy)   []  97130 (Cognitive Training, each add'l 15 minutes)  []  97164 (Re-evaluation)                              []   Other, List CPT Code ____________  []  50932 (Therapeutic Activities)     []  67124 (Self Care)   [x]  All codes above (97110 - 97535)  []  97012 (Mechanical Traction)  []  97014 (E-stim Unattended)  []  97032 (E-stim manual)  []  97033 (Ionto)  []  97035 (Ultrasound) []  97750 (Physical Performance Training) []  [id number] (Aquatic Therapy) []  97016 (Vasopneumatic Device) []  [id number] (Paraffin) []  97034 (Contrast Bath) []  97597 (Wound Care 1st 20 sq cm) []  97598 (Wound Care each add'l 20 sq cm) []  97760 (Orthotic Fabrication, Fitting, Training Initial) []  [id number] (Prosthetic Management and Training Initial) []  [phone] (Orthotic or Prosthetic Training/ Modification Subsequent)   PHYSICAL THERAPY DISCHARGE SUMMARY  Visits  from Start of Care: 1  Current functional level related to goals / functional outcomes: See above   Remaining deficits: See above   Education / Equipment: HEP   Patient agrees to discharge. Patient goals were not met. Patient is being discharged due to not returning since the last visit.  Hilda Blades, PT, DPT, LAT, ATC 12/18/21  3:48 PM Phone: [phone] Fax: [phone]

## 2021-09-25 ENCOUNTER — Other Ambulatory Visit: Payer: Self-pay

## 2021-09-25 ENCOUNTER — Ambulatory Visit: Payer: Medicare HMO | Attending: Adult Medicine | Admitting: Physical Therapy

## 2021-09-25 ENCOUNTER — Encounter: Payer: Self-pay | Admitting: Physical Therapy

## 2021-09-25 DIAGNOSIS — M5459 Other low back pain: Secondary | ICD-10-CM

## 2021-09-25 DIAGNOSIS — M6281 Muscle weakness (generalized): Secondary | ICD-10-CM

## 2021-10-10 ENCOUNTER — Encounter: Payer: Medicare HMO | Admitting: Physical Therapy

## 2021-10-11 ENCOUNTER — Ambulatory Visit: Payer: Medicare HMO | Admitting: Physical Therapy

## 2023-12-08 NOTE — Therapy (Deleted)
 OUTPATIENT PHYSICAL THERAPY THORACOLUMBAR EVALUATION   Patient Name: Gregory Holloway. MRN: [id number] DOB:1955-06-03, 68 y.o., male Today's Date: 12/08/2023  END OF SESSION:   Past Medical History:  Diagnosis Date   Allergy    Nasonex    COVID-19 virus infection 01/2020   Essential hypertension    Hepatitis C    Past Surgical History:  Procedure Laterality Date   broken nose     COLONOSCOPY  2008   finegr ligament repair     Patient Active Problem List   Diagnosis Date Noted   COVID-19 virus infection    Hyperlipidemia 07/06/2019   Essential hypertension 01/01/2019   Vaccine counseling 08/13/2016   Hepatic cirrhosis ([facility]) 03/12/2016   Seasonal allergies 03/12/2016   Chronic hepatitis C without hepatic coma ([facility]) 11/27/2013    PCP: Pcp, No   REFERRING PROVIDER: Darnella Dorn SAUNDERS, MD  REFERRING DIAG: M54.51 (ICD-10-CM) - Vertebrogenic low back pain  Rationale for Evaluation and Treatment: Rehabilitation  THERAPY DIAG:  No diagnosis found.  ONSET DATE: chronic  SUBJECTIVE:                                                                                                                                                                                           SUBJECTIVE STATEMENT:   PERTINENT HISTORY:    PAIN:  Are you having pain? Yes: NPRS scale: *** Pain location: *** Pain description: *** Aggravating factors: *** Relieving factors: ***  PRECAUTIONS: None  RED FLAGS: None   WEIGHT BEARING RESTRICTIONS: No  FALLS:  Has patient fallen in last 6 months?   OCCUPATION: ***  PLOF: Independent  PATIENT GOALS: To manage my back pain  NEXT MD VISIT: TBD  OBJECTIVE:  Note: Objective measures were completed at Evaluation unless otherwise noted.  DIAGNOSTIC FINDINGS:    PATIENT SURVEYS:  Modified Oswestry:  MODIFIED OSWESTRY DISABILITY SCALE  Date: *** Score  Pain intensity {ODI 1:32962}  2. Personal care (washing, dressing, etc.) {ODI 2:32963}   3. Lifting {ODI 3:32964}  4. Walking {ODI 4:32965}  5. Sitting {ODI 5:32966}  6. Standing {ODI 6:32967}  7. Sleeping {ODI 7:32968}  8. Social Life {ODI 8:32969}  9. Traveling {ODI 9:32970}  10. Employment/ Homemaking {ODI 10:32971}  Total ***/50   Interpretation of scores: Score Category Description  0-20% Minimal Disability The patient can cope with most living activities. Usually no treatment is indicated apart from advice on lifting, sitting and exercise  21-40% Moderate Disability The patient experiences more pain and difficulty with sitting, lifting and standing. Travel and social life are more difficult and they may be disabled from work. Personal care, sexual activity and  sleeping are not grossly affected, and the patient can usually be managed by conservative means  41-60% Severe Disability Pain remains the main problem in this group, but activities of daily living are affected. These patients require a detailed investigation  61-80% Crippled Back pain impinges on all aspects of the patient's life. Positive intervention is required  81-100% Bed-bound  These patients are either bed-bound or exaggerating their symptoms  Bluford FORBES Zoe DELENA Karon DELENA, et al. Surgery versus conservative management of stable thoracolumbar fracture: the PRESTO feasibility RCT. [place] ([place]): [employer]; 2021 Nov. [facility] Technology Assessment, No. 25.62.) Appendix 3, Oswestry Disability Index category descriptors. Available from: [employer]  Minimally Clinically Important Difference (MCID) = 12.8%  MUSCLE LENGTH: Hamstrings: Right *** deg; Left *** deg Debby test: Right *** deg; Left *** deg  POSTURE: {posture:25561}  PALPATION: ***  LUMBAR ROM:   AROM eval  Flexion   Extension   Right lateral flexion   Left lateral flexion   Right rotation   Left rotation    (Blank rows = not tested)  LOWER EXTREMITY ROM:     {AROM/PROM:27142}   Right eval Left eval  Hip flexion    Hip extension    Hip abduction    Hip adduction    Hip internal rotation    Hip external rotation    Knee flexion    Knee extension    Ankle dorsiflexion    Ankle plantarflexion    Ankle inversion    Ankle eversion     (Blank rows = not tested)  LOWER EXTREMITY MMT:    MMT Right eval Left eval  Hip flexion    Hip extension    Hip abduction    Hip adduction    Hip internal rotation    Hip external rotation    Knee flexion    Knee extension    Ankle dorsiflexion    Ankle plantarflexion    Ankle inversion    Ankle eversion     (Blank rows = not tested)  LUMBAR SPECIAL TESTS:  Straight leg raise test: {pos/neg:25243} and Slump test: {pos/neg:25243}  FUNCTIONAL TESTS:  30 seconds chair stand test  GAIT: Distance walked: *** Assistive device utilized: {Assistive devices:23999} Level of assistance: {Levels of assistance:24026} Comments: ***  TREATMENT DATE: ***                                                                                                                                 PATIENT EDUCATION:  Education details: Discussed eval findings, rehab rationale and POC and patient is in agreement  Person educated: Patient Education method: Explanation and Handouts Education comprehension: verbalized understanding and needs further education  HOME EXERCISE PROGRAM: ***  ASSESSMENT:  CLINICAL IMPRESSION: Patient is a *** y.o. *** who was seen today for physical therapy evaluation and treatment for ***.   OBJECTIVE IMPAIRMENTS: {opptimpairments:25111}.   ACTIVITY LIMITATIONS: {activitylimitations:27494}  PERSONAL FACTORS: {Personal factors:25162} are also  affecting patient's functional outcome.   REHAB POTENTIAL: Good  CLINICAL DECISION MAKING: Stable/uncomplicated  EVALUATION COMPLEXITY: Moderate   GOALS: Goals reviewed with patient? No  SHORT TERM GOALS: Target date: ***  Patient to demonstrate  independence in HEP  Baseline: Goal status: INITIAL  2.  *** Baseline:  Goal status: INITIAL  3.  *** Baseline:  Goal status: INITIAL  4.  *** Baseline:  Goal status: INITIAL  5.  *** Baseline:  Goal status: INITIAL  6.  *** Baseline:  Goal status: INITIAL  LONG TERM GOALS: Target date: ***  Patient will increase 30s chair stand reps from *** to *** with/without arms to demonstrate and improved functional ability with less pain/difficulty as well as reduce fall risk.  Baseline:  Goal status: INITIAL  2.  Patient will acknowledge ***/10 pain at least once during episode of care   Baseline:  Goal status: INITIAL  3.  Patient will score at least ***% on FOTO to signify clinically meaningful improvement in functional abilities.   Baseline:  Goal status: INITIAL  4.  *** Baseline:  Goal status: INITIAL  5.  *** Baseline:  Goal status: INITIAL  6.  *** Baseline:  Goal status: INITIAL  PLAN:  PT FREQUENCY: 1-2x/week  PT DURATION: 6 weeks  PLANNED INTERVENTIONS: 97110-Therapeutic exercises, 97530- Therapeutic activity, [id number]- Neuromuscular re-education, 97535- Self Care, 02859- Manual therapy, and Patient/Family education.  PLAN FOR NEXT SESSION: HEP review and update, manual techniques as appropriate, aerobic tasks, ROM and flexibility activities, strengthening and PREs, TPDN, gait and balance training as needed     Reyes CHRISTELLA Kohut, PT 12/08/2023, 9:36 AM

## 2023-12-09 ENCOUNTER — Ambulatory Visit: Payer: Self-pay

## 2024-03-04 IMAGING — CT CT L SPINE W/O CM
3 of 5 series · 13 of 33 positions shown, 15 images · non-contrast
Comparison: None Available.

CLINICAL DATA: Low back pain.



[Series 5: l spine soft · axial · 0.33mm/px · z∈[+1026,+1268]mm · 7 of 145 slices shown, 9 images]
[im 12/145  soft-tissue]
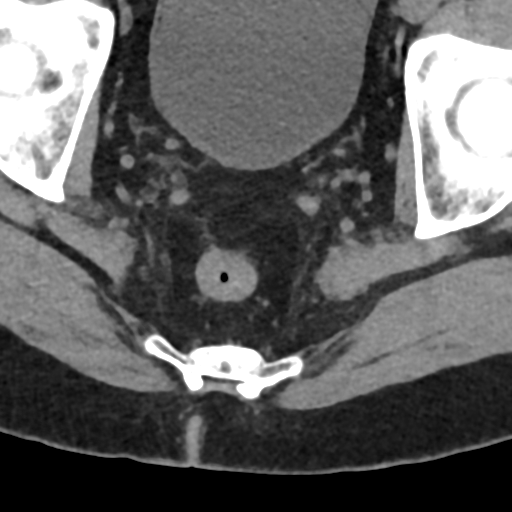
[im 12/145  bone]
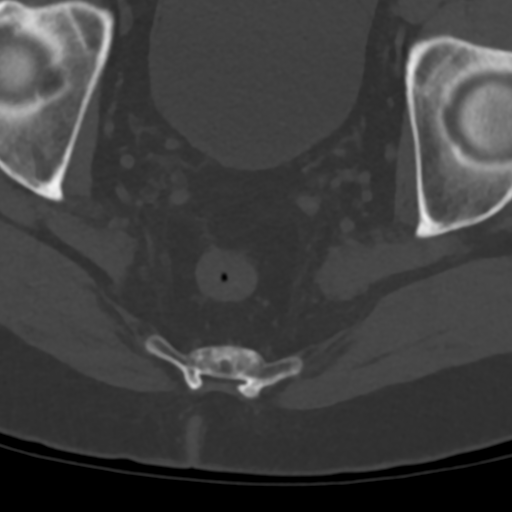
[im 34/145  bone]
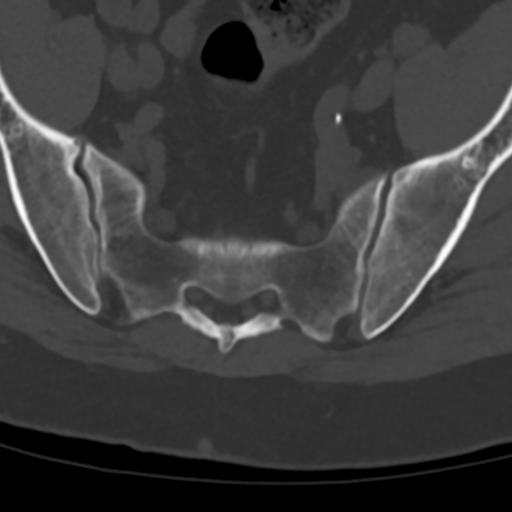
[im 56/145  bone]
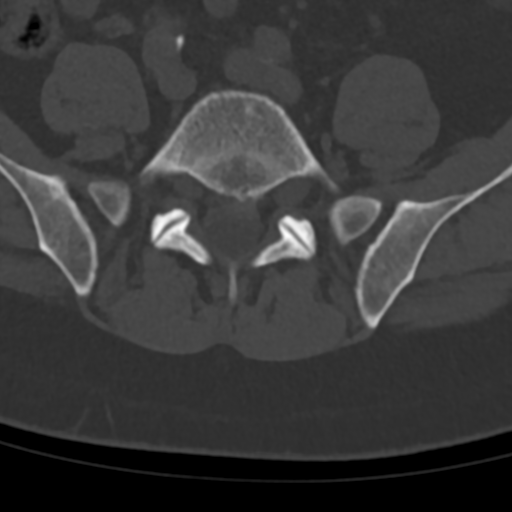
[im 78/145  bone]
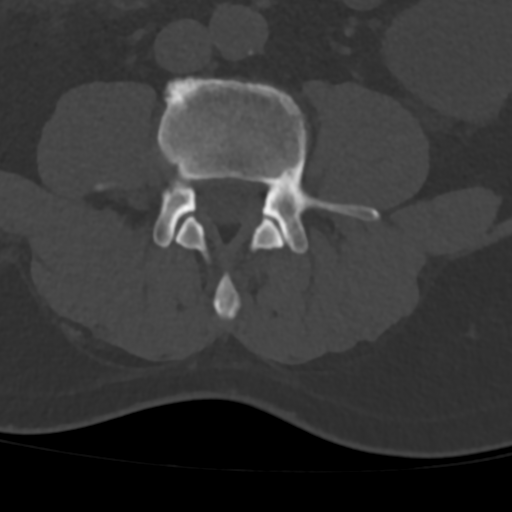
[im 89/145  soft-tissue]
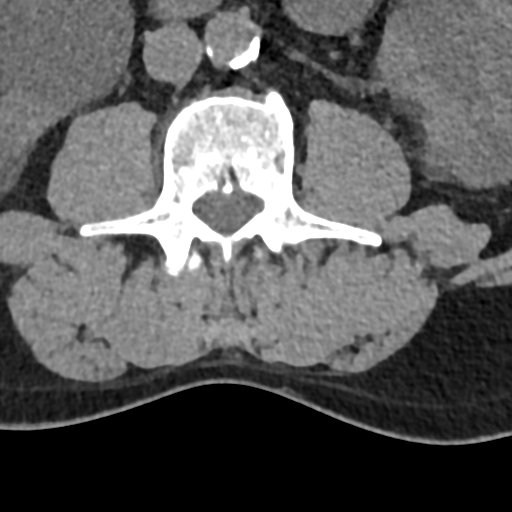
[im 89/145  bone]
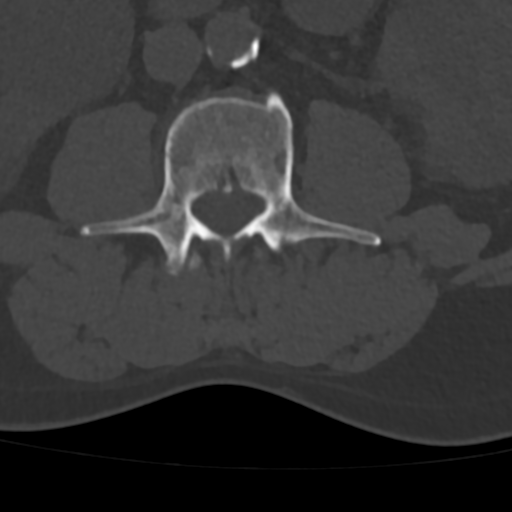
[im 111/145  bone]
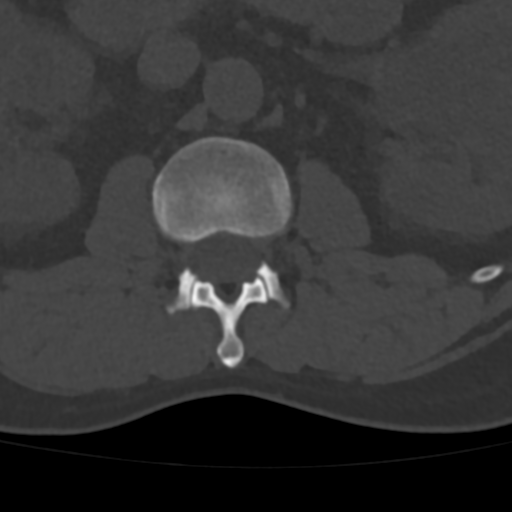
[im 133/145  bone]
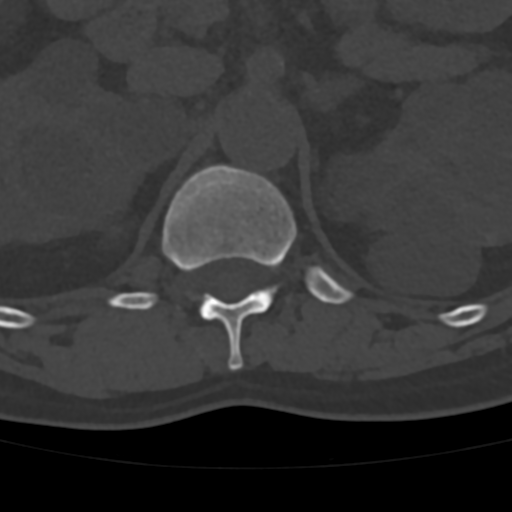

[Series 6: sag bone · sagittal · 0.34mm/px · 5 of 111 slices shown]
[im 19/111  bone]
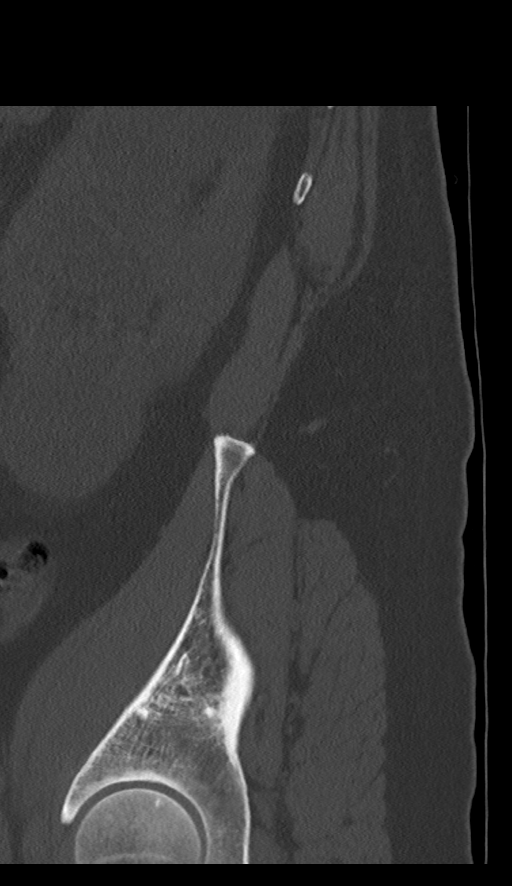
[im 37/111  bone]
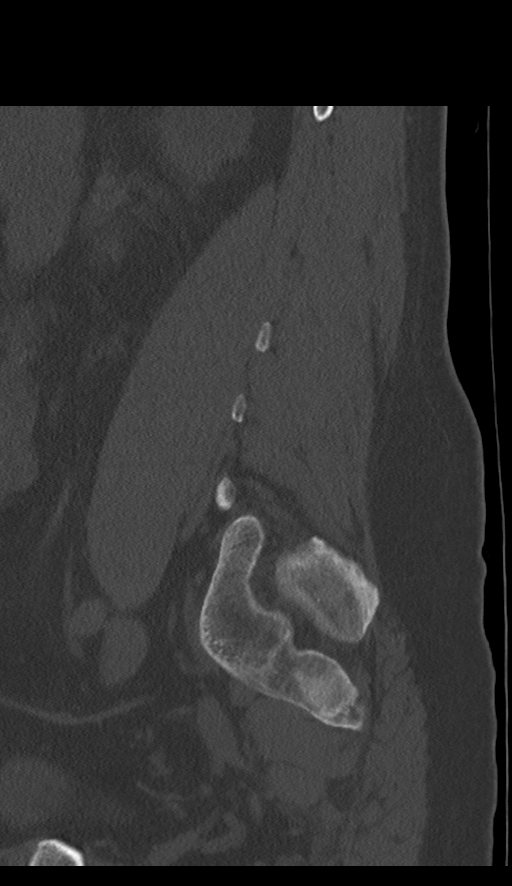
[im 56/111  bone]
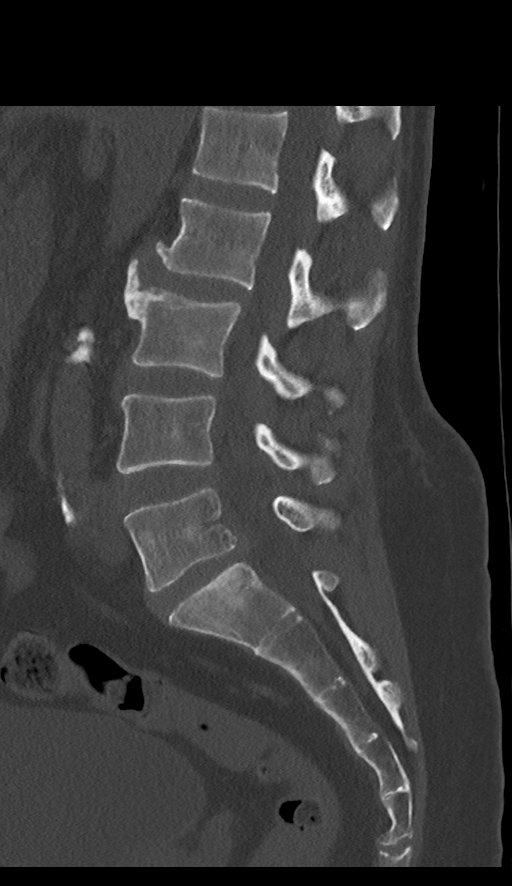
[im 74/111  bone]
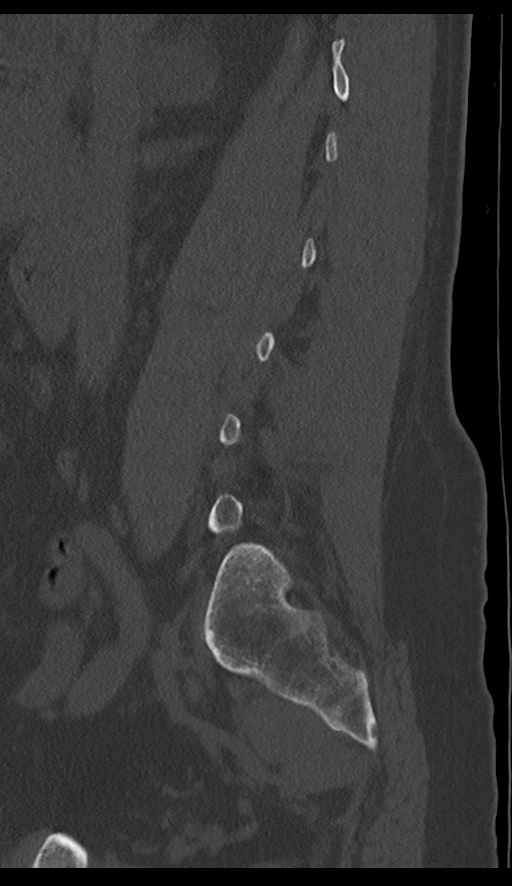
[im 92/111  bone]
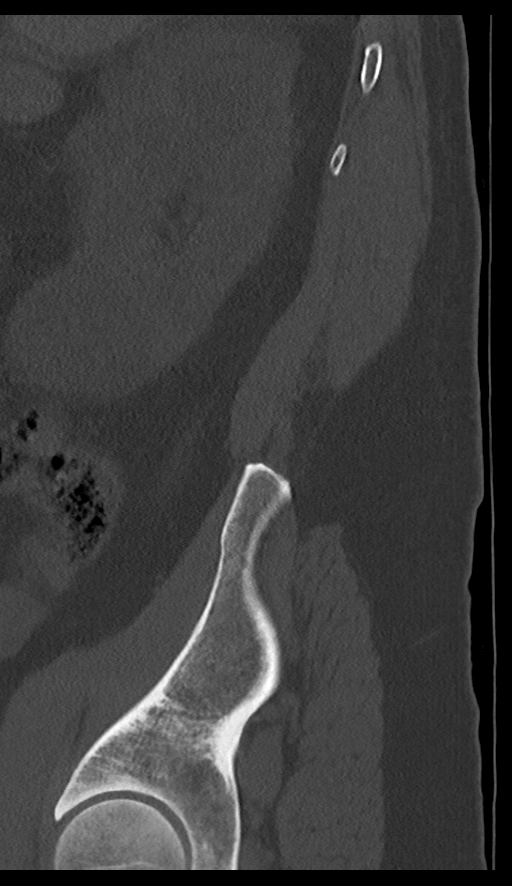

[Series 7: cor bone · coronal · 0.40mm/px · 1 of 98 slices shown]
[im 49/98  bone]
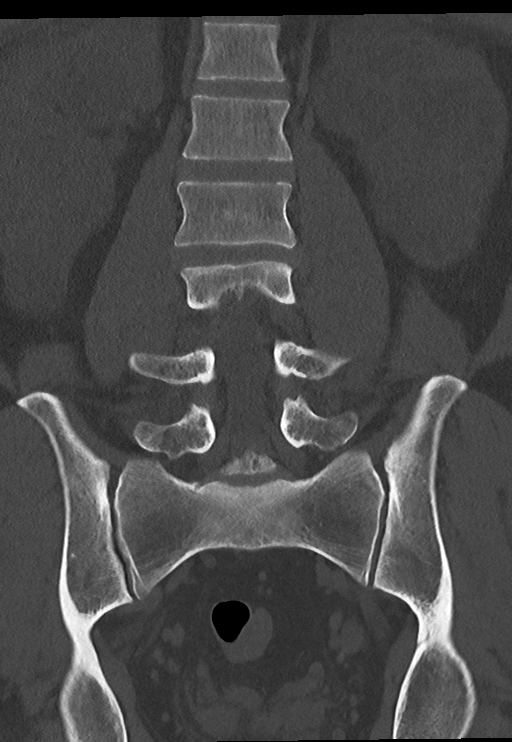

[13 of 33 positions shown; findings below may reference images not displayed]

FINDINGS: Segmentation: There are five lumbar type vertebral bodies. The last
full intervertebral disc space is labeled L5-S1.

Alignment: Normal

Vertebrae: No bone lesions or fractures are identified. The facets
are normally aligned. No pars defects. Large near bridging
syndesmophyte noted at L2-3.

Paraspinal and other soft tissues: Polycystic kidney disease noted
bilaterally. Aortic calcifications without aneurysm. No
retroperitoneal adenopathy. The celiac axis and SMA come off the
aorta as a single trunk.

Disc levels:

T12-L1: No significant findings.

L1-2: No significant findings.

L2-3: Mild diffuse annular bulge but no disc protrusion, spinal or
foraminal stenosis.

L3-4: Mild annular bulge but no spinal or foraminal stenosis.

L4-5: No significant findings.

L5-S1: Degenerative disc disease with mild disc space narrowing and
slight discogenic sclerosis. There is a shallow central disc
extrusion with flattening of the ventral thecal sac. The exiting L5
nerve roots are normal.
IMPRESSION: 1. Normal alignment and no acute bony findings.
2. Shallow central disc extrusion at L5-S1 with flattening of the
ventral thecal sac. The exiting L5 nerve roots are normal.
3. Polycystic kidney disease.
4. Aortic atherosclerosis.

Aortic Atherosclerosis ([id number]).
